# Patient Record
Sex: Female | Born: 1990 | Hispanic: No | Marital: Single
Health system: Southern US, Community
[De-identification: ages and names within clinical notes are randomized; demographics above are authoritative.]

## PROBLEM LIST (undated history)

## (undated) DIAGNOSIS — T4145XA Adverse effect of unspecified anesthetic, initial encounter: Secondary | ICD-10-CM

## (undated) DIAGNOSIS — Z789 Other specified health status: Secondary | ICD-10-CM

## (undated) DIAGNOSIS — T8859XA Other complications of anesthesia, initial encounter: Secondary | ICD-10-CM

## (undated) HISTORY — PX: CHOLECYSTECTOMY: SHX55

## (undated) HISTORY — PX: UNILATERAL SALPINGECTOMY: SHX6160

## (undated) HISTORY — PX: DILATION AND CURETTAGE OF UTERUS: SHX78

---
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
## 2012-02-26 ENCOUNTER — Emergency Department: Payer: Self-pay | Admitting: Emergency Medicine

## 2012-02-27 LAB — LIPASE, BLOOD: Lipase: 101 U/L (ref 73–393)

## 2012-02-27 LAB — CBC
HGB: 11.9 g/dL — ABNORMAL LOW (ref 12.0–16.0)
MCH: 29.8 pg (ref 26.0–34.0)
MCV: 88 fL (ref 80–100)
Platelet: 240 10*3/uL (ref 150–440)
RBC: 4 10*6/uL (ref 3.80–5.20)
RDW: 13.1 % (ref 11.5–14.5)
WBC: 9.7 10*3/uL (ref 3.6–11.0)

## 2012-02-27 LAB — URINALYSIS, COMPLETE
Blood: NEGATIVE
Glucose,UR: NEGATIVE mg/dL (ref 0–75)
Nitrite: NEGATIVE
Ph: 5 (ref 4.5–8.0)
Protein: 30
RBC,UR: 2 /HPF (ref 0–5)
Squamous Epithelial: 10
WBC UR: 19 /HPF (ref 0–5)

## 2012-02-27 LAB — COMPREHENSIVE METABOLIC PANEL
Albumin: 3.9 g/dL (ref 3.8–5.6)
BUN: 11 mg/dL (ref 7–18)
Bilirubin,Total: 0.5 mg/dL (ref 0.2–1.0)
EGFR (African American): >60
EGFR (Non-African Amer.): >60
Osmolality: 281 (ref 275–301)
SGPT (ALT): 26 U/L
Total Protein: 7.6 g/dL (ref 6.4–8.6)

## 2012-03-01 ENCOUNTER — Ambulatory Visit: Payer: Self-pay | Admitting: Surgery

## 2012-03-02 LAB — PATHOLOGY REPORT

## 2013-06-02 ENCOUNTER — Ambulatory Visit: Payer: Self-pay | Admitting: Family Medicine

## 2013-06-02 LAB — URINALYSIS, COMPLETE
Glucose,UR: NEGATIVE mg/dL (ref 0–75)
Protein: 30

## 2014-07-31 ENCOUNTER — Ambulatory Visit: Payer: Self-pay

## 2014-07-31 LAB — PREGNANCY, URINE: PREGNANCY TEST, URINE: NEGATIVE m[IU]/mL

## 2015-03-22 NOTE — H&P (Signed)
PATIENT NAME:  Tammy CoppSCUAL, CYNTHIA MR#:  [id number] DATE OF BIRTH:  13-Mar-1991  DATE OF ADMISSION:  03/01/2012  CHIEF COMPLAINT: Right upper quadrant pain.   HISTORY OF PRESENT ILLNESS: This is a patient with recurrent episodes of right upper quadrant pain associated with fatty food intolerance, mostly happens after meal in the evening. This has been going on for several weeks but she was in the Emergency Room three days ago with nausea, vomiting, and pain. Her pain is much improved at this point and she has had no further nausea or vomiting but is here to have elective laparoscopic cholecystectomy for control of her symptoms. She denies jaundice or acholic stools. No fevers or chills and no hematuria or melena.   PAST MEDICAL HISTORY: None.   PAST SURGICAL HISTORY: None.   ALLERGIES: None.   MEDICATIONS: None.   FAMILY HISTORY: Noncontributory.   SOCIAL HISTORY: She works as a Forensic psychologistproduction line worker. Does not smoke or drink.   REVIEW OF SYSTEMS: 10 system review has been performed and negative and is documented in the office chart.   PHYSICAL EXAMINATION:   GENERAL: Healthy Hispanic female patient. She appears in no acute distress.   VITAL SIGNS: Stable. She is afebrile.   HEENT: No scleral icterus.    NECK: No palpable neck nodes.   CHEST: Clear to auscultation.   CARDIAC: Regular rate and rhythm.   ABDOMEN: Soft, nontender. There is a supraumbilical piercing scar that is well healed she is no longer using. No Murphy's sign, etc.   EXTREMITIES: Without edema.   NEUROLOGIC: Grossly intact.   INTEGUMENTARY: No jaundice.   LABORATORY, DIAGNOSTIC, AND RADIOLOGICAL DATA: Laboratory values show no sign of choledocholithiasis. Hemoglobin and hematocrit is normal. White blood cell count is normal. Electrolytes are normal.   An ultrasound shows stones, slightly thickened gallbladder wall.   ASSESSMENT AND PLAN: This is a patient with symptomatic cholelithiasis. I have recommended  laparoscopic cholecystectomy. She wishes to have this done as soon as possible to avoid having this happen again and we have tried to schedule this for 03/01/2012. The rationale has been discussed. The options of observation and been reviewed, the risks of bleeding, infection, recurrence of symptoms, failure to resolve her symptoms, open procedure, bile duct damage, bile duct leak, retained common bile duct stone or bowel injury, any of which could require further surgery and/or ERCP, stent, and papillotomy have all been reviewed with her. She understood and agreed to proceed.    ____________________________ Adah Salvageichard E. Excell Seltzerooper, MD rec:drc D: 02/29/2012 14:12:18 ET T: 02/29/2012 14:24:15 ET JOB#: [id number]  cc: Adah Salvageichard E. Excell Seltzerooper, MD, <Dictator> Lattie HawICHARD E Rydge Texidor MD ELECTRONICALLY SIGNED 02/29/2012 17:26

## 2015-12-17 ENCOUNTER — Encounter: Payer: Self-pay | Admitting: *Deleted

## 2015-12-17 ENCOUNTER — Ambulatory Visit
Admission: EM | Admit: 2015-12-17 | Discharge: 2015-12-17 | Disposition: A | Discharge status: Home / Self Care | Payer: Managed Care, Other (non HMO) | Attending: Family Medicine | Admitting: Family Medicine

## 2015-12-17 DIAGNOSIS — J042 Acute laryngotracheitis: Secondary | ICD-10-CM

## 2015-12-17 DIAGNOSIS — J019 Acute sinusitis, unspecified: Secondary | ICD-10-CM | POA: Diagnosis not present

## 2015-12-17 LAB — RAPID STREP SCREEN (MED CTR MEBANE ONLY): STREPTOCOCCUS, GROUP A SCREEN (DIRECT): NEGATIVE

## 2015-12-17 MED ORDER — BENZONATATE 200 MG PO CAPS: 200.0000 mg | ORAL_CAPSULE | Freq: Three times a day (TID) | ORAL | Status: DC | PRN

## 2015-12-17 MED ORDER — AMOXICILLIN-POT CLAVULANATE 875-125 MG PO TABS: 1.0000 | ORAL_TABLET | Freq: Two times a day (BID) | ORAL | Status: DC

## 2015-12-17 MED ORDER — FEXOFENADINE-PSEUDOEPHED ER 180-240 MG PO TB24: 1.0000 | ORAL_TABLET | Freq: Every day | ORAL | Status: DC

## 2015-12-17 NOTE — ED Provider Notes (Signed)
CSN: [id number]     Arrival date & time 12/17/15  1325 History   First MD Initiated Contact with Patient 12/17/15 1404     Nurses notes were reviewed. Chief Complaint  Patient presents with  . Otalgia    bilateral  . Sore Throat    Patient states she's been sick now since January 9. States is progressively getting worse she doesn't her voice completely very hoarse. The last days Since worse sore and she's lost her voice as well. She also complains of left ear pain as well. Patient does not smoke and does not. Other than surgery cholecystectomy no other problems     (Consider location/radiation/quality/duration/timing/severity/associated sxs/prior Treatment) Patient is a 25 y.o. female presenting with ear pain and pharyngitis. The history is provided by the patient. No language interpreter was used.  Otalgia Location:  Left Quality:  Pressure Severity:  Moderate Onset quality:  Sudden Progression:  Worsening Chronicity:  New Context: not direct blow   Relieved by:  OTC medications Ineffective treatments:  OTC medications Associated symptoms: cough, rhinorrhea and sore throat   Associated symptoms: no abdominal pain and no fever   Risk factors: no chronic ear infection   Sore Throat Pertinent negatives include no abdominal pain.    History reviewed. No pertinent past medical history. Past Surgical History  Procedure Laterality Date  . Cholecystectomy     History reviewed. No pertinent family history. Social History  Substance Use Topics  . Smoking status: Never Smoker   . Smokeless tobacco: Never Used  . Alcohol Use: Yes   OB History    No data available     Review of Systems  Constitutional: Negative for fever.  HENT: Positive for ear pain, rhinorrhea and sore throat.   Respiratory: Positive for cough.   Gastrointestinal: Negative for abdominal pain.    Allergies  Review of patient's allergies indicates no known allergies.  Home Medications   Prior to  Admission medications   Medication Sig Start Date End Date Taking? Authorizing Provider  amoxicillin-clavulanate (AUGMENTIN) 875-125 MG tablet Take 1 tablet by mouth 2 (two) times daily. 12/17/15   Hassan Rowan, MD  benzonatate (TESSALON) 200 MG capsule Take 1 capsule (200 mg total) by mouth 3 (three) times daily as needed for cough. 12/17/15   Hassan Rowan, MD  fexofenadine-pseudoephedrine (ALLEGRA-D ALLERGY & CONGESTION) 180-240 MG 24 hr tablet Take 1 tablet by mouth daily. 12/17/15   Hassan Rowan, MD   Meds Ordered and Administered this Visit  Medications - No data to display  BP 116/52 mmHg  Pulse 76  Temp(Src) 98 F (36.7 C) (Oral)  Resp 20  Ht  (1.575 m)  Wt 152 lb (68.947 kg)  BMI 27.79 kg/m2  SpO2 98%  LMP 11/30/2014 No data found.   Physical Exam  Constitutional: She is oriented to person, place, and time. She appears well-developed and well-nourished.  HENT:  Head: Normocephalic and atraumatic.  Right Ear: Hearing and external ear normal. Tympanic membrane is bulging.  Left Ear: Hearing and external ear normal. Tympanic membrane is bulging.  Nose: Mucosal edema and rhinorrhea present.  Mouth/Throat: Uvula is midline. Posterior oropharyngeal erythema present.  Left also is much larger than the right erythema of the oral mucosa and neck supple patient markedly hoarse as well  Eyes: Conjunctivae are normal. Pupils are equal, round, and reactive to light.  Neck: Normal range of motion. Neck supple. No thyromegaly present.  Neurological: She is alert and oriented to person, place, and time.  Skin: Skin is warm and dry. No erythema.  Psychiatric: She has a normal mood and affect.  Vitals reviewed.   ED Course  Procedures (including critical care time)  Labs Review Labs Reviewed  RAPID STREP SCREEN (NOT AT [facility])  CULTURE, GROUP A STREP [facility])    Imaging Review No results found.   Visual Acuity Review  Right Eye Distance:   Left Eye Distance:   Bilateral  Distance:    Right Eye Near:   Left Eye Near:    Bilateral Near:         MDM   1. Acute laryngitis and tracheitis   2. Acute sinusitis, recurrence not specified, unspecified location    Place Augmentin 875 Allegra-D and Tessalon Perles. Note for today and tomorrow return follow-up when necessary.    Hassan Rowan, MD 12/17/15 1444

## 2015-12-17 NOTE — Discharge Instructions (Signed)
Laryngitis Laryngitis is swelling (inflammation) of your vocal cords. This causes hoarseness, coughing, loss of voice, sore throat, or a dry throat. When your vocal cords are inflamed, your voice sounds different. Laryngitis can be temporary (acute) or long-term (chronic). Most cases of acute laryngitis improve with time. Chronic laryngitis is laryngitis that lasts for more than three weeks. HOME CARE  Drink enough fluid to keep your pee (urine) clear or pale yellow.  Breathe in moist air. Use a humidifier if you live in a dry climate.  Take medicines only as told by your doctor.  Do not smoke cigarettes or electronic cigarettes. If you need help quitting, ask your doctor.  Talk as little as possible. Also avoid whispering, which can cause vocal strain.  Write instead of talking. Do this until your voice is back to normal. GET HELP IF:  You have a fever.  Your pain is worse.  You have trouble swallowing. GET HELP RIGHT AWAY IF:  You cough up blood.  You have trouble breathing.   This information is not intended to replace advice given to you by your health care provider. Make sure you discuss any questions you have with your health care provider.   Document Released: 11/03/2011 Document Revised: 12/05/2014 Document Reviewed: 04/29/2014 Elsevier Interactive Patient Education 2016 [employer].  Sinusitis, Adult Sinusitis is redness, soreness, and puffiness (inflammation) of the air pockets in the bones of your face (sinuses). The redness, soreness, and puffiness can cause air and mucus to get trapped in your sinuses. This can allow germs to grow and cause an infection.  HOME CARE   Drink enough fluids to keep your pee (urine) clear or pale yellow.  Use a humidifier in your home.  Run a hot shower to create steam in the bathroom. Sit in the bathroom with the door closed. Breathe in the steam 3-4 times a day.  Put a warm, moist washcloth on your face 3-4 times a day, or as  told by your doctor.  Use salt water sprays (saline sprays) to wet the thick fluid in your nose. This can help the sinuses drain.  Only take medicine as told by your doctor. GET HELP RIGHT AWAY IF:   Your pain gets worse.  You have very bad headaches.  You are sick to your stomach (nauseous).  You throw up (vomit).  You are very sleepy (drowsy) all the time.  Your face is puffy (swollen).  Your vision changes.  You have a stiff neck.  You have trouble breathing. MAKE SURE YOU:   Understand these instructions.  Will watch your condition.  Will get help right away if you are not doing well or get worse.   This information is not intended to replace advice given to you by your health care provider. Make sure you discuss any questions you have with your health care provider.   Document Released: 05/02/2008 Document Revised: 12/05/2014 Document Reviewed: 06/19/2012 Elsevier Interactive Patient Education 2016 [employer] Inc[employer]  Upper Respiratory Infection, Adult Most upper respiratory infections (URIs) are caused by a virus. A URI affects the nose, throat, and upper air passages. The most common type of URI is often called "the common cold." HOME CARE   Take medicines only as told by your doctor.  Gargle warm saltwater or take cough drops to comfort your throat as told by your doctor.  Use a warm mist humidifier or inhale steam from a shower to increase air moisture. This may make it easier to breathe.  Drink  enough fluid to keep your pee (urine) clear or pale yellow.  Eat soups and other clear broths.  Have a healthy diet.  Rest as needed.  Go back to work when your fever is gone or your doctor says it is okay.  You may need to stay home longer to avoid giving your URI to others.  You can also wear a face mask and wash your hands often to prevent spread of the virus.  Use your inhaler more if you have asthma.  Do not use any tobacco products, including  cigarettes, chewing tobacco, or electronic cigarettes. If you need help quitting, ask your doctor. GET HELP IF:  You are getting worse, not better.  Your symptoms are not helped by medicine.  You have chills.  You are getting more short of breath.  You have brown or red mucus.  You have yellow or brown discharge from your nose.  You have pain in your face, especially when you bend forward.  You have a fever.  You have puffy (swollen) neck glands.  You have pain while swallowing.  You have white areas in the back of your throat. GET HELP RIGHT AWAY IF:   You have very bad or constant:  Headache.  Ear pain.  Pain in your forehead, behind your eyes, and over your cheekbones (sinus pain).  Chest pain.  You have long-lasting (chronic) lung disease and any of the following:  Wheezing.  Long-lasting cough.  Coughing up blood.  A change in your usual mucus.  You have a stiff neck.  You have changes in your:  Vision.  Hearing.  Thinking.  Mood. MAKE SURE YOU:   Understand these instructions.  Will watch your condition.  Will get help right away if you are not doing well or get worse.   This information is not intended to replace advice given to you by your health care provider. Make sure you discuss any questions you have with your health care provider.   Document Released: 05/02/2008 Document Revised: 03/31/2015 Document Reviewed: 02/19/2014 Elsevier Interactive Patient Education [employer]

## 2015-12-17 NOTE — ED Notes (Signed)
Patient started having the symptom of sore throat on 12/09/15. Ear pain started 2 days ago. Patient does not have a history of ear pain or strep throat.

## 2015-12-19 LAB — CULTURE, GROUP A STREP (THRC)

## 2016-11-17 ENCOUNTER — Ambulatory Visit
Admission: EM | Admit: 2016-11-17 | Discharge: 2016-11-17 | Disposition: A | Discharge status: Home / Self Care | Payer: Managed Care, Other (non HMO) | Attending: Internal Medicine | Admitting: Internal Medicine

## 2016-11-17 DIAGNOSIS — Z3201 Encounter for pregnancy test, result positive: Secondary | ICD-10-CM

## 2016-11-17 DIAGNOSIS — O039 Complete or unspecified spontaneous abortion without complication: Principal | ICD-10-CM

## 2016-11-17 LAB — URINALYSIS, COMPLETE (UACMP) WITH MICROSCOPIC
Bilirubin Urine: NEGATIVE
Glucose, UA: NEGATIVE mg/dL
Ketones, ur: NEGATIVE mg/dL
Leukocytes, UA: NEGATIVE
Nitrite: POSITIVE — AB
Protein, ur: NEGATIVE mg/dL
Specific Gravity, Urine: 1.02 (ref 1.005–1.030)
pH: 7.5 (ref 5.0–8.0)

## 2016-11-17 LAB — PREGNANCY, URINE: Preg Test, Ur: POSITIVE — AB

## 2016-11-17 NOTE — ED Triage Notes (Addendum)
Pt c/o having a menstrual cycle for the last 12 days, She says typically they only last 3 days. She denies weakness or fatigue. She thought she could be pregnant, however didn't take a pregnancy test and then her cycle started. Bleeding is sometimes bright red and then dark .She does have some cramping.

## 2016-11-17 NOTE — ED Provider Notes (Signed)
[facility]    CSN: [id number] Arrival date & time: 11/17/16  1529     History   Chief Complaint Chief Complaint  Patient presents with  . Vaginal Bleeding    HPI Tammy Delacruz is a 25 y.o. female.   C/o vaginal bleeding x2 weeks.  Pt missed her expected period this month.  Started bleeding 1 week later.  The bleeding has been intermittently spotty to heavy.  Currently light.  Normal periods the prior two months.  She is sexually active.      No past medical history on file.  There are no active problems to display for this patient.   Past Surgical History:  Procedure Laterality Date  . CHOLECYSTECTOMY      OB History    No data available       Home Medications    Prior to Admission medications   Not on File    Family History No family history on file.  Social History Social History  Substance Use Topics  . Smoking status: Never Smoker  . Smokeless tobacco: Never Used  . Alcohol use Yes     Allergies   Patient has no known allergies.   Review of Systems Review of Systems  Constitutional: Negative for chills and fever.  HENT: Negative for sore throat and tinnitus.   Eyes: Negative for redness.  Respiratory: Negative for cough and shortness of breath.   Cardiovascular: Negative for chest pain and palpitations.  Gastrointestinal: Negative for abdominal pain (cramping), diarrhea, nausea and vomiting.  Genitourinary: Positive for vaginal bleeding. Negative for dysuria, frequency, urgency and vaginal discharge.  Musculoskeletal: Negative for myalgias.  Skin: Negative for rash.       No lesions  Neurological: Negative for weakness.  Hematological: Does not bruise/bleed easily.  Psychiatric/Behavioral: Negative for suicidal ideas.     Physical Exam Triage Vital Signs ED Triage Vitals  Enc Vitals Group     BP 11/17/16 1607 113/62     Pulse Rate 11/17/16 1607 65     Resp 11/17/16 1607 18     Temp 11/17/16 1607 98.5  F (36.9 C)     Temp Source 11/17/16 1607 Oral     SpO2 11/17/16 1607 100 %     Weight 11/17/16 1607 165 lb (74.8 kg)     Height 11/17/16 1607 5\' 1"  (1.549 m)     Head Circumference --      Peak Flow --      Pain Score 11/17/16 1609 0     Pain Loc --      Pain Edu? --      Excl. in GC? --    No data found.   Updated Vital Signs BP 113/62 (BP Location: Left Arm)   Pulse 65   Temp 98.5 F (36.9 C) (Oral)   Resp 18   Ht 5\' 1"  (1.549 m)   Wt 165 lb (74.8 kg)   LMP 11/17/2016   SpO2 100%   BMI 31.18 kg/m   Visual Acuity Right Eye Distance:   Left Eye Distance:   Bilateral Distance:    Right Eye Near:   Left Eye Near:    Bilateral Near:     Physical Exam  Constitutional: She is oriented to person, place, and time. She appears well-developed and well-nourished. No distress.  HENT:  Head: Normocephalic and atraumatic.  Mouth/Throat: Oropharynx is clear and moist.  Eyes: Conjunctivae and EOM are normal. Pupils are equal, round, and reactive to light.  No scleral icterus.  Neck: Normal range of motion. Neck supple. No JVD present. No tracheal deviation present. No thyromegaly present.  Cardiovascular: Normal rate, regular rhythm and normal heart sounds.  Exam reveals no gallop and no friction rub.   No murmur heard. Pulmonary/Chest: Effort normal and breath sounds normal.  Abdominal: Soft. Bowel sounds are normal. She exhibits no distension. There is no tenderness.  Genitourinary:  Genitourinary Comments: Deferred  Musculoskeletal: Normal range of motion. She exhibits no edema.  Lymphadenopathy:    She has no cervical adenopathy.  Neurological: She is alert and oriented to person, place, and time. No cranial nerve deficit.  Skin: Skin is warm and dry.  Psychiatric: She has a normal mood and affect. Her behavior is normal. Judgment and thought content normal.  Nursing note and vitals reviewed.    UC Treatments / Results  Labs (all labs ordered are listed, but only  abnormal results are displayed) Labs Reviewed  PREGNANCY, URINE - Abnormal; Notable for the following:       Result Value   Preg Test, Ur POSITIVE (*)    All other components within normal limits  URINALYSIS, COMPLETE (UACMP) WITH MICROSCOPIC - Abnormal; Notable for the following:    APPearance CLOUDY (*)    Hgb urine dipstick LARGE (*)    Nitrite POSITIVE (*)    Squamous Epithelial / LPF 6-30 (*)    Bacteria, UA MANY (*)    All other components within normal limits    EKG  EKG Interpretation None       Radiology No results found.  Procedures Procedures (including critical care time)  Medications Ordered in UC Medications - No data to display   Initial Impression / Assessment and Plan / UC Course  I have reviewed the triage vital signs and the nursing notes.  Pertinent labs & imaging results that were available during my care of the patient were reviewed by me and considered in my medical decision making (see chart for details).  Clinical Course     Positive urine pregnancy in setting of prolonged vaginal bleeding.  Likely spontaneous abortion, however I emphasized to the patient the possibility of spotting/passing clots even when there is a viable pregnancy (given it is difficult to quantify bleeding).  Thus, she must follow up with obstetrics ASAP.    Final Clinical Impressions(s) / UC Diagnoses   Final diagnoses:  Abortion, spontaneous    New Prescriptions New Prescriptions   No medications on file     Arnaldo NatalMichael S Cordelle Dahmen, MD 11/17/16 1728

## 2016-11-22 ENCOUNTER — Encounter: Payer: Self-pay | Admitting: Emergency Medicine

## 2016-11-22 ENCOUNTER — Emergency Department
Admission: EM | Admit: 2016-11-22 | Discharge: 2016-11-22 | Disposition: A | Discharge status: Home / Self Care | Payer: Managed Care, Other (non HMO) | Attending: Emergency Medicine | Admitting: Emergency Medicine

## 2016-11-22 ENCOUNTER — Emergency Department: Payer: Managed Care, Other (non HMO)

## 2016-11-22 DIAGNOSIS — Z3A01 Less than 8 weeks gestation of pregnancy: Secondary | ICD-10-CM

## 2016-11-22 DIAGNOSIS — O200 Threatened abortion: Principal | ICD-10-CM

## 2016-11-22 DIAGNOSIS — O209 Hemorrhage in early pregnancy, unspecified: Secondary | ICD-10-CM | POA: Diagnosis present

## 2016-11-22 LAB — COMPREHENSIVE METABOLIC PANEL
ALBUMIN: 4.2 g/dL (ref 3.5–5.0)
ALK PHOS: 61 U/L (ref 38–126)
ALT: 19 U/L (ref 14–54)
ANION GAP: 8 (ref 5–15)
AST: 23 U/L (ref 15–41)
BUN: 14 mg/dL (ref 6–20)
CALCIUM: 9.3 mg/dL (ref 8.9–10.3)
CO2: 22 mmol/L (ref 22–32)
Chloride: 107 mmol/L (ref 101–111)
Creatinine, Ser: 0.72 mg/dL (ref 0.44–1.00)
GFR calc non Af Amer: >60 mL/min (ref >60)
GLUCOSE: 111 mg/dL — AB (ref 65–99)
POTASSIUM: 3.6 mmol/L (ref 3.5–5.1)
SODIUM: 137 mmol/L (ref 135–145)
Total Bilirubin: 0.4 mg/dL (ref 0.3–1.2)
Total Protein: 7.9 g/dL (ref 6.5–8.1)

## 2016-11-22 LAB — ABO/RH: ABO/RH(D): O POS

## 2016-11-22 LAB — CBC WITH DIFFERENTIAL/PLATELET
BASOS PCT: 0 %
Basophils Absolute: 0 10*3/uL (ref 0–0.1)
EOS ABS: 0.5 10*3/uL (ref 0–0.7)
Eosinophils Relative: 5 %
HCT: 37.5 % (ref 35.0–47.0)
HEMOGLOBIN: 13.2 g/dL (ref 12.0–16.0)
LYMPHS ABS: 3.6 10*3/uL (ref 1.0–3.6)
Lymphocytes Relative: 35 %
MCH: 31 pg (ref 26.0–34.0)
MCHC: 35.1 g/dL (ref 32.0–36.0)
MCV: 88.4 fL (ref 80.0–100.0)
MONO ABS: 0.7 10*3/uL (ref 0.2–0.9)
MONOS PCT: 7 %
NEUTROS PCT: 53 %
Neutro Abs: 5.6 10*3/uL (ref 1.4–6.5)
PLATELETS: 276 10*3/uL (ref 150–440)
RBC: 4.24 MIL/uL (ref 3.80–5.20)
RDW: 13.1 % (ref 11.5–14.5)
WBC: 10.5 10*3/uL (ref 3.6–11.0)

## 2016-11-22 LAB — HCG, QUANTITATIVE, PREGNANCY: hCG, Beta Chain, Quant, S: 58 m[IU]/mL — ABNORMAL HIGH (ref <5)

## 2016-11-22 NOTE — ED Triage Notes (Signed)
Patient presents to the ED with vaginal bleeding x 17 days and positive pregnancy test at Urgent Care on Thursday.  [facility] because she would need an US or blood work to confirm her positive pregnancy test.  Patient reports needing to change her pad approx. 3 x day.  Patient denies pain, no obvious distress at this time.

## 2016-11-22 NOTE — ED Notes (Signed)
Pt alert and oriented X4, active, cooperative, pt in NAD. RR even and unlabored, color WNL.    

## 2016-11-22 NOTE — ED Provider Notes (Signed)
[facility] Emergency Department Provider Note        Time seen: ----------------------------------------- 4:36 PM on 11/22/2016 -----------------------------------------    I have reviewed the triage vital signs and the nursing notes.   HISTORY  Chief Complaint Vaginal Bleeding    HPI Tammy Delacruz is a 25 y.o. female who presents to the ER for vaginal bleeding for the last 17 days. She reportedly has a positive pregnancy test as an outpatient and she was seen in urgent care on Thursday and was told she might be having a miscarriage but they did not have the equipment to do an ultrasound. She reports nearly need to change her pad approximately 3 times a day. She denies any significant pain at this time, denies any other complaints.   History reviewed. No pertinent past medical history.  There are no active problems to display for this patient.   Past Surgical History:  Procedure Laterality Date  . CHOLECYSTECTOMY      Allergies Patient has no known allergies.  Social History Social History  Substance Use Topics  . Smoking status: Never Smoker  . Smokeless tobacco: Never Used  . Alcohol use Yes    Review of Systems Constitutional: Negative for fever. Cardiovascular: Negative for chest pain. Respiratory: Negative for shortness of breath. Gastrointestinal: Negative for abdominal pain, vomiting and diarrhea. Genitourinary: Negative for dysuria.Positive for vaginal bleeding Musculoskeletal: Negative for back pain. Skin: Negative for rash. Neurological: Negative for headaches, focal weakness or numbness.  10-point ROS otherwise negative.  ____________________________________________   PHYSICAL EXAM:  VITAL SIGNS: ED Triage Vitals  Enc Vitals Group     BP 11/22/16 1602 (!) 142/71     Pulse Rate 11/22/16 1602 78     Resp 11/22/16 1602 18     Temp 11/22/16 1602 97.7 F (36.5 C)     Temp Source 11/22/16 1602 Oral   SpO2 11/22/16 1602 98 %     Weight 11/22/16 1602 165 lb (74.8 kg)     Height 11/22/16 1602 5\' 2"  (1.575 m)     Head Circumference --      Peak Flow --      Pain Score 11/22/16 1633 0     Pain Loc --      Pain Edu? --      Excl. in GC? --     Constitutional: Alert and oriented. Well appearing and in no distress. Eyes: Conjunctivae are normal. PERRL. Normal extraocular movements. ENT   Head: Normocephalic and atraumatic.   Nose: No congestion/rhinnorhea.   Mouth/Throat: Mucous membranes are moist.   Neck: No stridor. Cardiovascular: Normal rate, regular rhythm. No murmurs, rubs, or gallops. Respiratory: Normal respiratory effort without tachypnea nor retractions. Breath sounds are clear and equal bilaterally. No wheezes/rales/rhonchi. Gastrointestinal: Soft and nontender. Normal bowel sounds Musculoskeletal: Nontender with normal range of motion in all extremities. No lower extremity tenderness nor edema. Neurologic:  Normal speech and language. No gross focal neurologic deficits are appreciated.  Skin:  Skin is warm, dry and intact. No rash noted. Psychiatric: Mood and affect are normal. Speech and behavior are normal.  ____________________________________________  ED COURSE:  Pertinent labs & imaging results that were available during my care of the patient were reviewed by me and considered in my medical decision making (see chart for details). Clinical Course   Patient is in no distress, we will assess with labs and ultrasound.  Procedures ____________________________________________   LABS (pertinent positives/negatives)  Labs Reviewed  HCG, QUANTITATIVE, PREGNANCY -  Abnormal; Notable for the following:       Result Value   hCG, Beta Chain, Quant, S 58 (*)    All other components within normal limits  COMPREHENSIVE METABOLIC PANEL - Abnormal; Notable for the following:    Glucose, Bld 111 (*)    All other components within normal limits  CBC WITH  DIFFERENTIAL/PLATELET  ABO/RH    RADIOLOGY  Pregnancy ultrasound IMPRESSION: 1. No visible gestational sac or adnexal mass. No free pelvic fluid. Differential diagnostic considerations include very early pregnancy or prior spontaneous abortion. Correlate with quantitative beta HCG trend and follow up. ____________________________________________  FINAL ASSESSMENT AND PLAN  Threatened miscarriage  Plan: Patient with labs and imaging as dictated above. Patient with an hCG level of 58, likely miscarriage. I will advise follow-up in 2 days for repeat hCG testing. This is likely spontaneous miscarriage.   Tammy FilbertWilliams, Tammy Padget E, MD   Note: This dictation was prepared with Dragon dictation. Any transcriptional errors that result from this process are unintentional    Tammy FilbertJonathan Delacruz Heidee Audi, MD 11/22/16 [phone]

## 2016-11-22 NOTE — ED Notes (Signed)
Pt vaginal bleeding X 17 days. Seen at urgent care last week and told she was having possible miscarriage. Pt unaware if she was pregnant. Pt alert and oriented X4, active, cooperative, pt in NAD. RR even and unlabored, color WNL.

## 2016-11-22 NOTE — ED Notes (Signed)
ED Provider at bedside. 

## 2016-11-25 ENCOUNTER — Encounter: Payer: Self-pay | Admitting: Emergency Medicine

## 2016-11-25 ENCOUNTER — Emergency Department
Admission: EM | Admit: 2016-11-25 | Discharge: 2016-11-25 | Disposition: A | Discharge status: Home / Self Care | Payer: Managed Care, Other (non HMO) | Attending: Emergency Medicine | Admitting: Emergency Medicine

## 2016-11-25 DIAGNOSIS — O200 Threatened abortion: Principal | ICD-10-CM

## 2016-11-25 DIAGNOSIS — Z3A00 Weeks of gestation of pregnancy not specified: Secondary | ICD-10-CM

## 2016-11-25 DIAGNOSIS — O209 Hemorrhage in early pregnancy, unspecified: Secondary | ICD-10-CM | POA: Diagnosis present

## 2016-11-25 LAB — HCG, QUANTITATIVE, PREGNANCY: HCG, BETA CHAIN, QUANT, S: 56 m[IU]/mL — AB (ref <5)

## 2016-11-25 NOTE — ED Provider Notes (Signed)
[facility] Emergency Department Provider Note  ____________________________________________  Time seen: Approximately 5:32 PM  I have reviewed the triage vital signs and the nursing notes.   HISTORY  Chief Complaint Labs Only   HPI Tammy Delacruz is a 25 y.o. female presents to the emergency department for repeat serum hCG level. He was evaluated here 3 days ago for vaginal bleeding. She denies abdominal cramping or pain today. She states the bleeding has been a little heavier today, but manageable. She does not have an appointment scheduled with gynecology.  History reviewed. No pertinent past medical history.  There are no active problems to display for this patient.   Past Surgical History:  Procedure Laterality Date  . CHOLECYSTECTOMY      Prior to Admission medications   Not on File    Allergies Patient has no known allergies.  No family history on file.  Social History Social History  Substance Use Topics  . Smoking status: Never Smoker  . Smokeless tobacco: Never Used  . Alcohol use Yes    Review of Systems Constitutional: Negative for fever. Gastrointestinal: No abdominal pain.  No nausea, no vomiting.  No diarrhea.  Musculoskeletal: Negative for generalized body aches. Skin: Negative for rash/lesion/wound. Neurological: Negative for headaches, focal weakness or numbness.  ____________________________________________   PHYSICAL EXAM:  VITAL SIGNS:114/66; heart rate 70, respiratory rate 20, SPO2 99%, temperature 98.6. ED Triage Vitals [11/25/16 1723]  Enc Vitals Group     BP      Pulse      Resp      Temp      Temp src      SpO2      Weight      Height      Head Circumference      Peak Flow      Pain Score 0     Pain Loc      Pain Edu?      Excl. in GC?     Constitutional: Alert and oriented. Well appearing and in no acute distress. Eyes: Conjunctivae are normal. PERRL. EOMI. Head:  Atraumatic. Nose: No congestion/rhinnorhea. Mouth/Throat: Mucous membranes are moist. Neck: No stridor.  Cardiovascular: Normal rate, regular rhythm. Good peripheral circulation. Respiratory: Normal respiratory effort. Musculoskeletal: Full ROM throughout.  Neurologic:  Normal speech and language. No gross focal neurologic deficits are appreciated. Speech is normal. No gait instability. Skin:  Skin is warm, dry and intact. No rash noted. Psychiatric: Mood and affect are normal. Speech and behavior are normal.  ____________________________________________   LABS (all labs ordered are listed, but only abnormal results are displayed)  Labs Reviewed  HCG, QUANTITATIVE, PREGNANCY - Abnormal; Notable for the following:       Result Value   hCG, Beta Chain, Quant, S 56 (*)    All other components within normal limits   ____________________________________________  EKG  Not indicated ____________________________________________  RADIOLOGY  Not indicated today ____________________________________________   PROCEDURES  None ____________________________________________   INITIAL IMPRESSION / ASSESSMENT AND PLAN / ED COURSE  Clinical Course     ___________________________________________   FINAL CLINICAL IMPRESSION(S) / ED DIAGNOSES  25 year old female presenting to the emergency department for a repeat beta hCG. She was here on Tuesday for pelvic pain and vaginal bleeding. Her beta hCG during that visit was 58. Today, it is 6056. She has had no increase in pain or bleeding. She states that she saturates approximately 3 pads per day. She was instructed to follow-up  with gynecology and she is to call on Tuesday for an appointment. She was advised to return to the emergency department if she is saturating 1 pad or more per hour or if she has an increase in abdominal pain and cramping. She was given pelvic rest instructions as well.  Final diagnoses:  Threatened miscarriage        Chinita PesterCari B Trevyon Swor, FNP 11/25/16 2212    Minna AntisKevin Paduchowski, MD 11/25/16 2227

## 2016-11-25 NOTE — Discharge Instructions (Signed)
Return to the ER if you are having to change a saturated pad every hour or for other symptoms of concern.  Follow up with gynecology as soon as possible--call Tuesday morning for an appointment. Do not use tampons. No sexual intercourse until cleared by gynecology. Do not use douche or insert anything into the vagina.

## 2016-11-25 NOTE — ED Triage Notes (Signed)
Reports miscarriage 2 days ago, told to return for repeat beta

## 2016-12-01 ENCOUNTER — Ambulatory Visit (INDEPENDENT_AMBULATORY_CARE_PROVIDER_SITE_OTHER): Payer: Commercial Managed Care - PPO | Admitting: Obstetrics and Gynecology

## 2016-12-01 ENCOUNTER — Encounter: Payer: Self-pay | Admitting: Obstetrics and Gynecology

## 2016-12-01 VITALS — BP 100/63 | HR 76 | Ht 62.0 in | Wt 155.2 lb

## 2016-12-01 DIAGNOSIS — O200 Threatened abortion: Principal | ICD-10-CM

## 2016-12-01 HISTORY — DX: Threatened abortion: O20.0

## 2016-12-01 NOTE — Progress Notes (Signed)
GYN ENCOUNTER NOTE  Subjective:       Tammy Delacruz is a 26 y.o. [id number] female is here for gynecologic evaluation of the following issues:  1.Threatened abortion  Patient has been experiencing daily bleeding since early December without passage of tissue or onset of pelvic pain. Emergency room evaluation is notable for no intrauterine pregnancy and Abnormal rise of quantitative hCG titers. Patient is in a monogamous relationship over the last 6 months; contraception none.  O+ blood type Quantitative hCG 11/22/2016-56 Quantitative hCG 11/25/2016-58 No abdominal pelvic pain Light vaginal bleeding continues No history of tissue passage per vagina No ectopic risk factors (STD history negative; endometriosis history negative; surgical history negative)   Gynecologic History Patient's last menstrual period was 10/01/2016 (approximate). Contraception: none Last Pap: Never Last mammogram: N/A  Obstetric History OB History  Gravida Para Term Preterm AB Living  1       1    SAB TAB Ectopic Multiple Live Births  1            # Outcome Date GA Lbr Len/2nd Weight Sex Delivery Anes PTL Lv  1 SAB 2017              No past medical history on file.  Past Surgical History:  Procedure Laterality Date  . CHOLECYSTECTOMY      No current outpatient prescriptions on file prior to visit.   No current facility-administered medications on file prior to visit.     No Known Allergies  Social History   Social History  . Marital status: Single    Spouse name: N/A  . Number of children: N/A  . Years of education: N/A   Occupational History  . Not on file.   Social History Main Topics  . Smoking status: Never Smoker  . Smokeless tobacco: Never Used  . Alcohol use Yes     Comment: occas  . Drug use: No  . Sexual activity: Yes    Birth control/ protection: None   Other Topics Concern  . Not on file   Social History Narrative  . No narrative on file    Family  History  Problem Relation Age of Onset  . Diabetes Maternal Grandmother   . Breast cancer Neg Hx   . Ovarian cancer Neg Hx   . Colon cancer Neg Hx   . Heart disease Neg Hx     The following portions of the patient's history were reviewed and updated as appropriate: allergies, current medications, past family history, past medical history, past social history, past surgical history and problem list.  Review of Systems Review of Systems - Comprehensive review of systems is negative  Objective:   BP 100/63   Pulse 76   Ht 5\' 2"  (1.575 m)   Wt 155 lb 3.2 oz (70.4 kg)   LMP 10/01/2016 (Approximate)   BMI 28.39 kg/m  CONSTITUTIONAL: Well-developed, well-nourished female in no acute distress.  HENT:  Normocephalic, atraumatic.  NECK: Not examined SKIN: Skin is warm and dry. No rash noted. Not diaphoretic. No erythema. No pallor. NEUROLGIC: Alert and oriented to person, place, and time. PSYCHIATRIC: Normal mood and affect. Normal behavior. Normal judgment and thought content. CARDIOVASCULAR:Not Examined RESPIRATORY: Not Examined BREASTS: Not Examined ABDOMEN: Soft, non distended; Non tender.  No Organomegaly. PELVIC:  External Genitalia: Normal  BUS: Normal  Vagina: Normal  Cervix: Normal; minimal bloody mucus noted at os; Os closed; no cervical motion tenderness  Uterus: Normal size, shape,consistency, mobile, nonenlarged, nontender  Adnexa: No palpable masses; minimal right lower quadrant tenderness; no left adnexal tenderness  RV: Normal external exam  Bladder: Nontender MUSCULOSKELETAL: Normal range of motion. No tenderness.  No cyanosis, clubbing, or edema.     Assessment:   1. Threatened abortion; regnancy is likely nonviable due to history of 17 days of bleeding without passage of tissue and abnormal hCG titer trend, and negative ultrasound for IUP or adnexal pathology. Cannot rule out ectopic. O+ blood type-no need for RhoGAM - Beta HCG, Quant     Plan:   1.  Quantitative hCG today 2. Return in 1 week for follow-up and repeat quantitative hCG 3. If hCG titer it plateaus or slowly rises, suction D&C with laparoscopy will be offered versus empiric methotrexate therapy 4. Ectopic precautions given. Return immediately for onset of severe right lower quadrant or left lower quadrant pain  Herold Harms, MD  Note: This dictation was prepared with Dragon dictation along with smaller phrase technology. Any transcriptional errors that result from this process are unintentional.

## 2016-12-01 NOTE — Patient Instructions (Signed)
1. Obtain quantitative hCG blood test today 2. Return in 1 week for follow-up 3. Return immediately if you develop severe right sided or left-sided abdominal pelvic pain

## 2016-12-02 LAB — BETA HCG QUANT (REF LAB): HCG QUANT: 66 m[IU]/mL

## 2016-12-06 ENCOUNTER — Encounter: Payer: Self-pay | Admitting: Obstetrics and Gynecology

## 2016-12-06 ENCOUNTER — Ambulatory Visit (INDEPENDENT_AMBULATORY_CARE_PROVIDER_SITE_OTHER): Payer: Commercial Managed Care - PPO | Admitting: Obstetrics and Gynecology

## 2016-12-06 VITALS — BP 108/65 | HR 99 | Ht 62.0 in | Wt 154.5 lb

## 2016-12-06 DIAGNOSIS — O2691 Pregnancy related conditions, unspecified, first trimester: Secondary | ICD-10-CM

## 2016-12-06 DIAGNOSIS — O200 Threatened abortion: Principal | ICD-10-CM

## 2016-12-06 HISTORY — DX: Pregnancy related conditions, unspecified, first trimester: O26.91

## 2016-12-06 NOTE — Addendum Note (Signed)
Addended by: Marchelle FolksMILLER, Jerrold Haskell G on: 12/06/2016 10:37 AM   Modules accepted: Orders

## 2016-12-06 NOTE — Progress Notes (Signed)
Chief complaint: 1. Threatened abortion 2. Abnormal pregnancy  Patient presents for follow-up regarding threatened abortion, possible ectopic pregnancy. Serial quantitative hCG titers have a subnormal rise (16-10-96(55-58-66) She notes some mid abdominal cramping without lateralization of pain; she associates this abdomen discomfort with recent food intake. Vaginal spotting persists.  BP 108/65   Pulse 99   Ht 5\' 2"  (1.575 m)   Wt 154 lb 8 oz (70.1 kg)   LMP 10/01/2016 (Approximate)   BMI 28.26 kg/m  Well-appearing female in no acute distress. Abdomen: Nontender without peritoneal signs  ASSESSMENT: 1. Abnormal pregnancy, likely ectopic 2. No evidence of abnormality on pelvic ultrasound 1 week ago 3. No acute change in symptomatology 4. Abnormal persistent quantitative hCG rise  PLAN: 1.  Extensive discussion regarding management is completed-suction D&C/laparoscopy versus methotrexate therapy. 2.  Patient is to contemplate options and let us know if and 24 hours regarding choice of management. 3. Acute ectopic precautions given 4. If patient chooses methotrexate therapy, [facility] for expedited management of this condition.  A total of 25 minutes were spent face-to-face with the patient during this encounter and over half of that time involved counseling and coordination of care.  Herold HarmsMartin A Meriem Lemieux, MD  Note: This dictation was prepared with Dragon dictation along with smaller phrase technology. Any transcriptional errors that result from this process are unintentional.

## 2016-12-06 NOTE — Patient Instructions (Signed)
1. Call office on 12/07/2016 regarding decision of management for ectopic pregnancy (surgery versus methotrexate therapy)   Ectopic Pregnancy An ectopic pregnancy is when the fertilized egg attaches (implants) outside the uterus. Most ectopic pregnancies occur in one of the tubes where eggs travel from the ovary to the uterus (fallopian tubes), but the implanting can occur in other locations. In rare cases, ectopic pregnancies occur on the ovary, intestine, pelvis, abdomen, or cervix. In an ectopic pregnancy, the fertilized egg does not have the ability to develop into a normal, healthy baby. A ruptured ectopic pregnancy is one in which tearing or bursting of a fallopian tube causes internal bleeding. Often, there is intense lower abdominal pain, and vaginal bleeding sometimes occurs. Having an ectopic pregnancy can be life-threatening. If this dangerous condition is not treated, it can lead to blood loss, shock, or even death. What are the causes? The most common cause of this condition is damage to one of the fallopian tubes. A fallopian tube may be narrowed or blocked, and that keeps the fertilized egg from reaching the uterus. What increases the risk? This condition is more likely to develop in women of childbearing age who have different levels of risk. The levels of risk can be divided into three categories. High risk  You have gone through infertility treatment.  You have had an ectopic pregnancy before.  You have had surgery on the fallopian tubes, or another surgical procedure, such as an abortion.  You have had surgery to have the fallopian tubes tied (tubal ligation).  You have problems or diseases of the fallopian tubes.  You have been exposed to diethylstilbestrol (DES). This medicine was used until 1971, and it had effects on babies whose mothers took the medicine.  You become pregnant while using an IUD (intrauterine device) for birth control. Moderate risk  You have a  history of infertility.  You have had an STI (sexually transmitted infection).  You have a history of pelvic inflammatory disease (PID).  You have scarring from endometriosis.  You have multiple sexual partners.  You smoke. Low risk  You have had pelvic surgery.  You use vaginal douches.  You became sexually active before age 89. What are the signs or symptoms? Common symptoms of this condition include normal pregnancy symptoms, such as missing a period, nausea, tiredness, abdominal pain, breast tenderness, and bleeding. However, ectopic pregnancy will have additional symptoms, such as:  Pain with intercourse.  Irregular vaginal bleeding or spotting.  Cramping or pain on one side or in the lower abdomen.  Fast heartbeat, low blood pressure, and sweating.  Passing out while having a bowel movement. Symptoms of a ruptured ectopic pregnancy and internal bleeding may include:  Sudden, severe pain in the abdomen and pelvis.  Dizziness, weakness, light-headedness, or fainting.  Pain in the shoulder or neck area. How is this diagnosed? This condition is diagnosed by:  A pelvic exam to locate pain or a mass in the abdomen.  A pregnancy test. This blood test checks for the presence as well as the specific level of pregnancy hormone in the bloodstream.  Ultrasound. This is performed if a pregnancy test is positive. In this test, a probe is inserted into the vagina. The probe will detect a fetus, possibly in a location other than the uterus.  Taking a sample of uterus tissue (dilation and curettage, or D&C).  Surgery to perform a visual exam of the inside of the abdomen using a thin, lighted tube that has a tiny  camera on the end (laparoscope).  Culdocentesis. This procedure involves inserting a needle at the top of the vagina, behind the uterus. If blood is present in this area, it may indicate that a fallopian tube is torn. How is this treated? This condition is treated  with medicine or surgery. Medicine  An injection of a medicine (methotrexate) may be given to cause the pregnancy tissue to be absorbed. This medicine may save your fallopian tube. It may be given if:  The diagnosis is made early, with no signs of active bleeding.  The fallopian tube has not ruptured.  You are considered to be a good candidate for the medicine. Usually, pregnancy hormone blood levels are checked after methotrexate treatment. This is to be sure that the medicine is effective. It may take 4-6 weeks for the pregnancy to be absorbed. Most pregnancies will be absorbed by 3 weeks. Surgery  A laparoscope may be used to remove the pregnancy tissue.  If severe internal bleeding occurs, a larger cut (incision) may be made in the lower abdomen (laparotomy) to remove the fetus and placenta. This is done to stop the bleeding.  Part or all of the fallopian tube may be removed (salpingectomy) along with the fetus and placenta. The fallopian tube may also be repaired during the surgery.  In very rare circumstances, removal of the uterus (hysterectomy) may be required.  After surgery, pregnancy hormone testing may be done to be sure that there is no pregnancy tissue left. Whether your treatment is medicine or surgery, you may receive a Rho (D) immune globulin shot to prevent problems with any future pregnancy. This shot may be given if:  You are Rh-negative and the baby's father is Rh-positive.  You are Rh-negative and you do not know the Rh type of the baby's father. Follow these instructions at home:  Rest and limit your activity after the procedure for as long as told by your health care provider.  Until your health care provider says that it is safe:  Do not lift anything that is heavier than 10 lb (4.5 kg), or the limit that your health care provider tells you.  Avoid physical exercise and any movement that requires effort (is strenuous).  To help prevent  constipation:  Eat a healthy diet that includes fruits, vegetables, and whole grains.  Drink 6-8 glasses of water per day. Get help right away if:  You develop worsening pain that is not relieved by medicine.  You have:  A fever or chills.  Vaginal bleeding.  Redness and swelling at the incision site.  Nausea and vomiting.  You feel dizzy or weak.  You feel light-headed or you faint. This information is not intended to replace advice given to you by your health care provider. Make sure you discuss any questions you have with your health care provider. Document Released: 12/22/2004 Document Revised: 07/13/2016 Document Reviewed: 06/15/2016 Elsevier Interactive Patient Education  2017 [employer]

## 2016-12-08 ENCOUNTER — Encounter
Admission: RE | Admit: 2016-12-08 | Discharge: 2016-12-08 | Disposition: A | Discharge status: Home / Self Care | Payer: Commercial Managed Care - PPO | Source: Ambulatory Visit | Attending: Obstetrics and Gynecology | Admitting: Obstetrics and Gynecology

## 2016-12-08 ENCOUNTER — Ambulatory Visit (INDEPENDENT_AMBULATORY_CARE_PROVIDER_SITE_OTHER): Payer: Commercial Managed Care - PPO | Admitting: Obstetrics and Gynecology

## 2016-12-08 ENCOUNTER — Encounter: Payer: Self-pay | Admitting: Obstetrics and Gynecology

## 2016-12-08 VITALS — BP 96/66 | HR 85 | Ht 62.0 in | Wt 151.4 lb

## 2016-12-08 DIAGNOSIS — O2691 Pregnancy related conditions, unspecified, first trimester: Secondary | ICD-10-CM

## 2016-12-08 DIAGNOSIS — Z01818 Encounter for other preprocedural examination: Principal | ICD-10-CM

## 2016-12-08 DIAGNOSIS — O021 Missed abortion: Secondary | ICD-10-CM | POA: Diagnosis not present

## 2016-12-08 DIAGNOSIS — Z3A01 Less than 8 weeks gestation of pregnancy: Secondary | ICD-10-CM | POA: Diagnosis not present

## 2016-12-08 DIAGNOSIS — O00101 Right tubal pregnancy without intrauterine pregnancy: Secondary | ICD-10-CM | POA: Diagnosis present

## 2016-12-08 DIAGNOSIS — N888 Other specified noninflammatory disorders of cervix uteri: Secondary | ICD-10-CM | POA: Diagnosis not present

## 2016-12-08 DIAGNOSIS — K661 Hemoperitoneum: Secondary | ICD-10-CM | POA: Diagnosis not present

## 2016-12-08 HISTORY — DX: Other specified health status: Z78.9

## 2016-12-08 HISTORY — DX: Adverse effect of unspecified anesthetic, initial encounter: T41.45XA

## 2016-12-08 HISTORY — DX: Other complications of anesthesia, initial encounter: T88.59XA

## 2016-12-08 LAB — CBC WITH DIFFERENTIAL/PLATELET
Basophils Absolute: 0 10*3/uL (ref 0–0.1)
Basophils Relative: 1 %
Eosinophils Absolute: 0.2 10*3/uL (ref 0–0.7)
Eosinophils Relative: 3 %
HEMATOCRIT: 39.5 % (ref 35.0–47.0)
Hemoglobin: 13.4 g/dL (ref 12.0–16.0)
LYMPHS ABS: 2.6 10*3/uL (ref 1.0–3.6)
LYMPHS PCT: 36 %
MCH: 30 pg (ref 26.0–34.0)
MCHC: 34 g/dL (ref 32.0–36.0)
MCV: 88.3 fL (ref 80.0–100.0)
Monocytes Absolute: 0.9 10*3/uL (ref 0.2–0.9)
Monocytes Relative: 12 %
NEUTROS ABS: 3.4 10*3/uL (ref 1.4–6.5)
Neutrophils Relative %: 48 %
Platelets: 256 10*3/uL (ref 150–440)
RBC: 4.48 MIL/uL (ref 3.80–5.20)
RDW: 13.5 % (ref 11.5–14.5)
WBC: 7.2 10*3/uL (ref 3.6–11.0)

## 2016-12-08 LAB — TYPE AND SCREEN
ABO/RH(D): O POS
ANTIBODY SCREEN: NEGATIVE
EXTEND SAMPLE REASON: UNDETERMINED

## 2016-12-08 LAB — RAPID HIV SCREEN (HIV 1/2 AB+AG)
HIV 1/2 Antibodies: NONREACTIVE
HIV-1 P24 Antigen - HIV24: NONREACTIVE

## 2016-12-08 LAB — HCG, QUANTITATIVE, PREGNANCY: HCG, BETA CHAIN, QUANT, S: 100 m[IU]/mL — AB (ref <5)

## 2016-12-08 NOTE — Patient Instructions (Signed)
1. Return in 1 week after surgery for postop check 

## 2016-12-08 NOTE — Progress Notes (Signed)
Subjective:  PREOPERATIVE HISTORY AND PHYSICAL    Date of surgery: 12/09/2016  Procedures:  Suction D&C  Laparoscopy with removal of ectopic Diagnoses:  Abnormal pregnancy in first trimester  Probable ectopic pregnancy   Patient is a 26 y.o. G1P0010female scheduled for suction D&C and laparoscopy for removal of ectopic pregnancy on 12/09/2016.  The patient remains asymptomatic except for some mild mid pelvic cramping. Persistent light spotting is noted. There has been no passage of tissue. Quantitative hCG trend has been abnormal (55-58-66) Pelvic ultrasound has demonstrated nothing in the uterus or adnexa.  Ultrasound: 11/22/2016 CLINICAL DATA:  Pelvic pain and vaginal bleeding. Early pregnancy. Quantitative beta HCG 58. Gestational age by last menstrual period 7 weeks 3 days.  EXAM: OBSTETRIC <14 WK US AND TRANSVAGINAL OB US  TECHNIQUE: Both transabdominal and transvaginal ultrasound examinations were performed for complete evaluation of the gestation as well as the maternal uterus, adnexal regions, and pelvic cul-de-sac. Transvaginal technique was performed to assess early pregnancy.  COMPARISON:  None.  FINDINGS: Intrauterine gestational sac: Absent  Yolk sac:  Absent  Embryo:  Absent  Cardiac Activity:  Subchorionic hemorrhage:  None visualized.  Maternal uterus/adnexae: Endometrial thickness 8 mm. Uterine length 8.3 cm. Small nabothian cyst noted.  Right ovary 3.8 by 2.3 by 1.7 cm. Left ovary 3.0 by 1.6 by 2.3 cm. Both ovaries appear normal and have expected color flow internally. No adnexal mass or free pelvic fluid.  IMPRESSION: 1. No visible gestational sac or adnexal mass. No free pelvic fluid. Differential diagnostic considerations include very early pregnancy or prior spontaneous abortion. Correlate with quantitative beta HCG trend and follow up.  HCG titers: 11/22/2016 58 11/25/2016 56 12/01/2016 66  ABO/Rh: O+   OB History    Gravida Para Term Preterm AB Living   1       1     SAB TAB Ectopic Multiple Live Births   1              Patient's last menstrual period was 10/01/2016 (approximate).    History reviewed. No pertinent past medical history.  Past Surgical History:  Procedure Laterality Date  . CHOLECYSTECTOMY      OB History  Gravida Para Term Preterm AB Living  1       1    SAB TAB Ectopic Multiple Live Births  1            # Outcome Date GA Lbr Len/2nd Weight Sex Delivery Anes PTL Lv  1 SAB 2017              Social History   Social History  . Marital status: Single    Spouse name: N/A  . Number of children: N/A  . Years of education: N/A   Social History Main Topics  . Smoking status: Never Smoker  . Smokeless tobacco: Never Used  . Alcohol use Yes     Comment: occas  . Drug use: No  . Sexual activity: Yes    Birth control/ protection: None   Other Topics Concern  . None   Social History Narrative  . None    Family History  Problem Relation Age of Onset  . Diabetes Maternal Grandmother   . Breast cancer Neg Hx   . Ovarian cancer Neg Hx   . Colon cancer Neg Hx   . Heart disease Neg Hx      (Not in a hospital admission)  No Known Allergies  Review of Systems Constitutional: No recent fever/chills/sweats   Respiratory: No recent cough/bronchitis Cardiovascular: No chest pain Gastrointestinal: No recent nausea/vomiting/diarrhea Genitourinary: No UTI symptoms Hematologic/lymphatic:No history of coagulopathy or recent blood thinner use    Objective:    BP 96/66   Pulse 85   Ht 5' 2" (1.575 m)   Wt 151 lb 6.4 oz (68.7 kg)   LMP 10/01/2016 (Approximate)   BMI 27.69 kg/m   General:   Normal  Skin:   normal  HEENT:  Normal  Neck:  Supple without Adenopathy or Thyromegaly  Lungs:   Heart:              Breasts:   Abdomen:  Pelvis:  M/S   Extremeties:  Neuro:    clear to auscultation bilaterally   Normal without murmur   Not Examined   soft,  non-tender; bowel sounds normal; no masses,  no organomegaly   Exam deferred to OR  No CVAT  Warm/Dry   Normal         12/01/2016 PELVIC:             External Genitalia: Normal             BUS: Normal             Vagina: Normal             Cervix: Normal; minimal bloody mucus noted at os; Os closed; no cervical motion tenderness             Uterus: Normal size, shape,consistency, mobile, nonenlarged, nontender             Adnexa: No palpable masses; minimal right lower quadrant tenderness; no left adnexal tenderness             RV: Normal external exam             Bladder: Nontender Assessment:    Abnormal first trimester pregnancy, cannot rule out ectopic   Plan:   1. Suction D&C 2. Laparoscopy with removal of ectopic pregnancy  PREOPERATIVE counseling: The patient is to undergo procedure for abnormal first trimester pregnancy. She is understanding of the procedures and is aware of and is accepting of all surgical risks which include but are not limited to bleeding,, pelvic organ injury with need for repair, blood clot disorders, anesthesia risks, etc. All questions have been answered. Informed consent is given. Patient is ready and willing to proceed with surgery as scheduled.  Paizley Ramella A Ajiah Mcglinn, MD  Note: This dictation was prepared with Dragon dictation along with smaller phrase technology. Any transcriptional errors that result from this process are unintentional.   

## 2016-12-08 NOTE — H&P (Signed)
Subjective:  PREOPERATIVE HISTORY AND PHYSICAL    Date of surgery: 12/09/2016  Procedures:  Suction D&C  Laparoscopy with removal of ectopic Diagnoses:  Abnormal pregnancy in first trimester  Probable ectopic pregnancy   Patient is a 26 y.o. G1P00510female scheduled for suction D&C and laparoscopy for removal of ectopic pregnancy on 12/09/2016.  The patient remains asymptomatic except for some mild mid pelvic cramping. Persistent light spotting is noted. There has been no passage of tissue. Quantitative hCG trend has been abnormal (40-98-11(55-58-66) Pelvic ultrasound has demonstrated nothing in the uterus or adnexa.  Ultrasound: 11/22/2016 CLINICAL DATA:  Pelvic pain and vaginal bleeding. Early pregnancy. Quantitative beta HCG 58. Gestational age by last menstrual period 7 weeks 3 days.  EXAM: OBSTETRIC <14 WK US AND TRANSVAGINAL OB US  TECHNIQUE: Both transabdominal and transvaginal ultrasound examinations were performed for complete evaluation of the gestation as well as the maternal uterus, adnexal regions, and pelvic cul-de-sac. Transvaginal technique was performed to assess early pregnancy.  COMPARISON:  None.  FINDINGS: Intrauterine gestational sac: Absent  Yolk sac:  Absent  Embryo:  Absent  Cardiac Activity:  Subchorionic hemorrhage:  None visualized.  Maternal uterus/adnexae: Endometrial thickness 8 mm. Uterine length 8.3 cm. Small nabothian cyst noted.  Right ovary 3.8 by 2.3 by 1.7 cm. Left ovary 3.0 by 1.6 by 2.3 cm. Both ovaries appear normal and have expected color flow internally. No adnexal mass or free pelvic fluid.  IMPRESSION: 1. No visible gestational sac or adnexal mass. No free pelvic fluid. Differential diagnostic considerations include very early pregnancy or prior spontaneous abortion. Correlate with quantitative beta HCG trend and follow up.  HCG titers: 11/22/2016 58 11/25/2016 56 12/01/2016 66  ABO/Rh: O+   OB History    Gravida Para Term Preterm AB Living   1       1     SAB TAB Ectopic Multiple Live Births   1              Patient's last menstrual period was 10/01/2016 (approximate).    History reviewed. No pertinent past medical history.  Past Surgical History:  Procedure Laterality Date  . CHOLECYSTECTOMY      OB History  Gravida Para Term Preterm AB Living  1       1    SAB TAB Ectopic Multiple Live Births  1            # Outcome Date GA Lbr Len/2nd Weight Sex Delivery Anes PTL Lv  1 SAB 2017              Social History   Social History  . Marital status: Single    Spouse name: N/A  . Number of children: N/A  . Years of education: N/A   Social History Main Topics  . Smoking status: Never Smoker  . Smokeless tobacco: Never Used  . Alcohol use Yes     Comment: occas  . Drug use: No  . Sexual activity: Yes    Birth control/ protection: None   Other Topics Concern  . None   Social History Narrative  . None    Family History  Problem Relation Age of Onset  . Diabetes Maternal Grandmother   . Breast cancer Neg Hx   . Ovarian cancer Neg Hx   . Colon cancer Neg Hx   . Heart disease Neg Hx      (Not in a hospital admission)  No Known Allergies  Review of Systems Constitutional: No recent fever/chills/sweats  Respiratory: No recent cough/bronchitis Cardiovascular: No chest pain Gastrointestinal: No recent nausea/vomiting/diarrhea Genitourinary: No UTI symptoms Hematologic/lymphatic:No history of coagulopathy or recent blood thinner use    Objective:    BP 96/66   Pulse 85   Ht 5\' 2"  (1.575 m)   Wt 151 lb 6.4 oz (68.7 kg)   LMP 10/01/2016 (Approximate)   BMI 27.69 kg/m   General:   Normal  Skin:   normal  HEENT:  Normal  Neck:  Supple without Adenopathy or Thyromegaly  Lungs:   Heart:              Breasts:   Abdomen:  Pelvis:  M/S   Extremeties:  Neuro:    clear to auscultation bilaterally   Normal without murmur   Not Examined   soft,  non-tender; bowel sounds normal; no masses,  no organomegaly   Exam deferred to OR  No CVAT  Warm/Dry   Normal         12/01/2016 PELVIC:             External Genitalia: Normal             BUS: Normal             Vagina: Normal             Cervix: Normal; minimal bloody mucus noted at os; Os closed; no cervical motion tenderness             Uterus: Normal size, shape,consistency, mobile, nonenlarged, nontender             Adnexa: No palpable masses; minimal right lower quadrant tenderness; no left adnexal tenderness             RV: Normal external exam             Bladder: Nontender Assessment:    Abnormal first trimester pregnancy, cannot rule out ectopic   Plan:   1. Suction D&C 2. Laparoscopy with removal of ectopic pregnancy  PREOPERATIVE counseling: The patient is to undergo procedure for abnormal first trimester pregnancy. She is understanding of the procedures and is aware of and is accepting of all surgical risks which include but are not limited to bleeding,, pelvic organ injury with need for repair, blood clot disorders, anesthesia risks, etc. All questions have been answered. Informed consent is given. Patient is ready and willing to proceed with surgery as scheduled.  Herold Harms, MD  Note: This dictation was prepared with Dragon dictation along with smaller phrase technology. Any transcriptional errors that result from this process are unintentional.

## 2016-12-08 NOTE — Patient Instructions (Signed)
  Your procedure is scheduled on: 12/09/16 Fri Report to Same Day Surgery 2nd floor medical mall [facility](Medical Mall Entrance-take elevator on left to 2nd floor.  Check in with surgery information desk.) at 11:30 am   Remember: Instructions that are not followed completely may result in serious medical risk, up to and including death, or upon the discretion of your surgeon and anesthesiologist your surgery may need to be rescheduled.    _x___ 1. Do not eat food or drink liquids after midnight. No gum chewing or hard candies.     __x__ 2. No Alcohol for 24 hours before or after surgery.   __x__3. No Smoking for 24 prior to surgery.   ____  4. Bring all medications with you on the day of surgery if instructed.    __x__ 5. Notify your doctor if there is any change in your medical condition     (cold, fever, infections).     Do not wear jewelry, make-up, hairpins, clips or nail polish.  Do not wear lotions, powders, or perfumes. You may wear deodorant.  Do not shave 48 hours prior to surgery. Men may shave face and neck.  Do not bring valuables to the hospital.    [facility] is not responsible for any belongings or valuables.               Contacts, dentures or bridgework may not be worn into surgery.  Leave your suitcase in the car. After surgery it may be brought to your room.  For patients admitted to the hospital, discharge time is determined by your treatment team.   Patients discharged the day of surgery will not be allowed to drive home.  You will need someone to drive you home and stay with you the night of your procedure.    Please read over the following fact sheets that you were given:   [facility] Preparing [facility] and or MRSA Information   _x___ Take these medicines the morning of surgery with A SIP OF WATER:    1. None 2.  3.  4.  5.  6.  ____Fleets enema or Magnesium Citrate as directed.   _x___ Use CHG Soap or sage wipes as directed on instruction sheet   ____  Use inhalers on the day of surgery and bring to hospital day of surgery  ____ Stop metformin 2 days prior to surgery    ____ Take 1/2 of usual insulin dose the night before surgery and none on the morning of           surgery.   ____ Stop Aspirin, Coumadin, Pllavix ,Eliquis, Effient, or Pradaxa  x__ Stop Anti-inflammatories such as Advil, Aleve, Ibuprofen, Motrin, Naproxen,          Naprosyn, Goodies powders or aspirin products. Ok to take Tylenol.   ____ Stop supplements until after surgery.    ____ Bring C-Pap to the hospital.

## 2016-12-09 ENCOUNTER — Encounter: Payer: Self-pay | Admitting: *Deleted

## 2016-12-09 ENCOUNTER — Ambulatory Visit
Admission: RE | Admit: 2016-12-09 | Discharge: 2016-12-09 | Disposition: A | Discharge status: Home / Self Care | Payer: Commercial Managed Care - PPO | Source: Ambulatory Visit | Attending: Obstetrics and Gynecology | Admitting: Obstetrics and Gynecology

## 2016-12-09 ENCOUNTER — Inpatient Hospital Stay: Payer: Commercial Managed Care - PPO | Admitting: Anesthesiology

## 2016-12-09 ENCOUNTER — Encounter
Admission: RE | Disposition: A | Discharge status: Home / Self Care | Payer: Self-pay | Source: Ambulatory Visit | Attending: Obstetrics and Gynecology

## 2016-12-09 DIAGNOSIS — K661 Hemoperitoneum: Secondary | ICD-10-CM

## 2016-12-09 DIAGNOSIS — Z9889 Other specified postprocedural states: Secondary | ICD-10-CM

## 2016-12-09 DIAGNOSIS — N888 Other specified noninflammatory disorders of cervix uteri: Secondary | ICD-10-CM

## 2016-12-09 DIAGNOSIS — O00101 Right tubal pregnancy without intrauterine pregnancy: Principal | ICD-10-CM

## 2016-12-09 DIAGNOSIS — O009 Unspecified ectopic pregnancy without intrauterine pregnancy: Secondary | ICD-10-CM

## 2016-12-09 DIAGNOSIS — O021 Missed abortion: Secondary | ICD-10-CM

## 2016-12-09 DIAGNOSIS — Z3A01 Less than 8 weeks gestation of pregnancy: Secondary | ICD-10-CM

## 2016-12-09 HISTORY — PX: DILATION AND EVACUATION: SHX1459

## 2016-12-09 HISTORY — PX: LAPAROSCOPY: SHX197

## 2016-12-09 LAB — RPR: RPR Ser Ql: NONREACTIVE

## 2016-12-09 LAB — POCT PREGNANCY, URINE: PREG TEST UR: NEGATIVE

## 2016-12-09 SURGERY — DILATION AND EVACUATION, UTERUS
Anesthesia: General

## 2016-12-09 MED ORDER — LIDOCAINE HCL (PF) 1 % IJ SOLN
INTRAMUSCULAR | Status: AC
  Filled 2016-12-09: qty 5

## 2016-12-09 MED ORDER — SUGAMMADEX SODIUM 200 MG/2ML IV SOLN
INTRAVENOUS | Status: DC | PRN
  Administered 2016-12-09: 140 mg via INTRAVENOUS

## 2016-12-09 MED ORDER — EPINEPHRINE PF 1 MG/ML IJ SOLN
INTRAMUSCULAR | Status: AC
  Filled 2016-12-09: qty 1

## 2016-12-09 MED ORDER — FENTANYL CITRATE (PF) 100 MCG/2ML IJ SOLN
INTRAMUSCULAR | Status: DC | PRN
  Administered 2016-12-09: 100 ug via INTRAVENOUS

## 2016-12-09 MED ORDER — FENTANYL CITRATE (PF) 100 MCG/2ML IJ SOLN
INTRAMUSCULAR | Status: AC
  Filled 2016-12-09: qty 2

## 2016-12-09 MED ORDER — ROCURONIUM BROMIDE 50 MG/5ML IV SOSY
PREFILLED_SYRINGE | INTRAVENOUS | Status: AC
  Filled 2016-12-09: qty 5

## 2016-12-09 MED ORDER — OXYCODONE HCL 5 MG PO TABS
ORAL_TABLET | ORAL | Status: AC
  Filled 2016-12-09: qty 1

## 2016-12-09 MED ORDER — PROPOFOL 10 MG/ML IV BOLUS
INTRAVENOUS | Status: AC
  Filled 2016-12-09: qty 20

## 2016-12-09 MED ORDER — SUCCINYLCHOLINE CHLORIDE 200 MG/10ML IV SOSY
PREFILLED_SYRINGE | INTRAVENOUS | Status: AC
  Filled 2016-12-09: qty 10

## 2016-12-09 MED ORDER — LACTATED RINGERS IV SOLN
INTRAVENOUS | Status: DC
  Administered 2016-12-09: 125 mL/h via INTRAVENOUS
  Administered 2016-12-09: 14:00:00 via INTRAVENOUS

## 2016-12-09 MED ORDER — LACTATED RINGERS IV SOLN: INTRAVENOUS | Status: DC

## 2016-12-09 MED ORDER — BUPIVACAINE HCL (PF) 0.5 % IJ SOLN
INTRAMUSCULAR | Status: AC
  Filled 2016-12-09: qty 30

## 2016-12-09 MED ORDER — KETOROLAC TROMETHAMINE 30 MG/ML IJ SOLN
INTRAMUSCULAR | Status: AC
  Filled 2016-12-09: qty 1

## 2016-12-09 MED ORDER — OXYCODONE HCL 5 MG PO TABS
5.0000 mg | ORAL_TABLET | Freq: Once | ORAL | Status: AC | PRN
  Administered 2016-12-09: 5 mg via ORAL

## 2016-12-09 MED ORDER — IBUPROFEN 600 MG PO TABS: 600.0000 mg | ORAL_TABLET | Freq: Four times a day (QID) | ORAL | 0 refills | Status: DC | PRN

## 2016-12-09 MED ORDER — FAMOTIDINE 20 MG PO TABS
20.0000 mg | ORAL_TABLET | Freq: Once | ORAL | Status: AC
  Administered 2016-12-09: 20 mg via ORAL

## 2016-12-09 MED ORDER — DEXAMETHASONE SODIUM PHOSPHATE 10 MG/ML IJ SOLN
INTRAMUSCULAR | Status: AC
  Filled 2016-12-09: qty 1

## 2016-12-09 MED ORDER — MIDAZOLAM HCL 5 MG/5ML IJ SOLN
INTRAMUSCULAR | Status: DC | PRN
  Administered 2016-12-09: 2 mg via INTRAVENOUS

## 2016-12-09 MED ORDER — ROCURONIUM BROMIDE 100 MG/10ML IV SOLN
INTRAVENOUS | Status: DC | PRN
  Administered 2016-12-09: 5 mg via INTRAVENOUS
  Administered 2016-12-09: 10 mg via INTRAVENOUS

## 2016-12-09 MED ORDER — FENTANYL CITRATE (PF) 100 MCG/2ML IJ SOLN
25.0000 ug | INTRAMUSCULAR | Status: DC | PRN
  Administered 2016-12-09 (×2): 25 ug via INTRAVENOUS

## 2016-12-09 MED ORDER — OXYCODONE HCL 5 MG/5ML PO SOLN: 5.0000 mg | Freq: Once | ORAL | Status: AC | PRN

## 2016-12-09 MED ORDER — LIDOCAINE HCL (CARDIAC) 20 MG/ML IV SOLN
INTRAVENOUS | Status: DC | PRN
  Administered 2016-12-09: 60 mg via INTRAVENOUS

## 2016-12-09 MED ORDER — ONDANSETRON HCL 4 MG/2ML IJ SOLN
INTRAMUSCULAR | Status: AC
  Filled 2016-12-09: qty 2

## 2016-12-09 MED ORDER — IBUPROFEN 600 MG PO TABS
600.0000 mg | ORAL_TABLET | Freq: Four times a day (QID) | ORAL | Status: DC | PRN
  Filled 2016-12-09: qty 1

## 2016-12-09 MED ORDER — MIDAZOLAM HCL 2 MG/2ML IJ SOLN
INTRAMUSCULAR | Status: AC
  Filled 2016-12-09: qty 2

## 2016-12-09 MED ORDER — ONDANSETRON HCL 4 MG/2ML IJ SOLN
4.0000 mg | Freq: Once | INTRAMUSCULAR | Status: AC
  Administered 2016-12-09: 4 mg via INTRAVENOUS

## 2016-12-09 MED ORDER — FAMOTIDINE 20 MG PO TABS
ORAL_TABLET | ORAL | Status: AC
  Administered 2016-12-09: 20 mg via ORAL
  Filled 2016-12-09: qty 1

## 2016-12-09 MED ORDER — PHENYLEPHRINE 40 MCG/ML (10ML) SYRINGE FOR IV PUSH (FOR BLOOD PRESSURE SUPPORT)
PREFILLED_SYRINGE | INTRAVENOUS | Status: AC
  Filled 2016-12-09: qty 10

## 2016-12-09 MED ORDER — OXYCODONE-ACETAMINOPHEN 5-325 MG PO TABS: 1.0000 | ORAL_TABLET | ORAL | 0 refills | Status: DC | PRN

## 2016-12-09 MED ORDER — SUCCINYLCHOLINE CHLORIDE 20 MG/ML IJ SOLN
INTRAMUSCULAR | Status: DC | PRN
  Administered 2016-12-09: 100 mg via INTRAVENOUS

## 2016-12-09 MED ORDER — PROPOFOL 10 MG/ML IV BOLUS
INTRAVENOUS | Status: DC | PRN
  Administered 2016-12-09: 150 mg via INTRAVENOUS

## 2016-12-09 MED ORDER — KETOROLAC TROMETHAMINE 30 MG/ML IJ SOLN
INTRAMUSCULAR | Status: DC | PRN
  Administered 2016-12-09: 30 mg via INTRAVENOUS

## 2016-12-09 MED ORDER — FENTANYL CITRATE (PF) 100 MCG/2ML IJ SOLN
INTRAMUSCULAR | Status: AC
  Administered 2016-12-09: 25 ug via INTRAVENOUS
  Filled 2016-12-09: qty 2

## 2016-12-09 MED ORDER — ONDANSETRON HCL 4 MG/2ML IJ SOLN
INTRAMUSCULAR | Status: DC | PRN
  Administered 2016-12-09: 4 mg via INTRAVENOUS

## 2016-12-09 MED ORDER — DEXAMETHASONE SODIUM PHOSPHATE 10 MG/ML IJ SOLN
INTRAMUSCULAR | Status: DC | PRN
  Administered 2016-12-09: 10 mg via INTRAVENOUS

## 2016-12-09 MED ORDER — OXYCODONE-ACETAMINOPHEN 5-325 MG PO TABS: 1.0000 | ORAL_TABLET | ORAL | Status: DC | PRN

## 2016-12-09 MED ORDER — ONDANSETRON HCL 4 MG/2ML IJ SOLN
INTRAMUSCULAR | Status: AC
  Administered 2016-12-09: 4 mg via INTRAVENOUS
  Filled 2016-12-09: qty 2

## 2016-12-09 SURGICAL SUPPLY — 46 items
BLADE SURG SZ11 CARB STEEL (BLADE) ×3 IMPLANT
CANISTER SUCT 1200ML W/VALVE (MISCELLANEOUS) ×3 IMPLANT
CATH ROBINSON RED A/P 16FR (CATHETERS) ×3 IMPLANT
CHLORAPREP W/TINT 26ML (MISCELLANEOUS) ×3 IMPLANT
DEFOGGER SCOPE WARMER CLEARIFY (MISCELLANEOUS) ×3 IMPLANT
DERMABOND ADVANCED (GAUZE/BANDAGES/DRESSINGS) ×4
DERMABOND ADVANCED .7 DNX12 (GAUZE/BANDAGES/DRESSINGS) ×2 IMPLANT
DRSG TELFA 3X8 NADH (GAUZE/BANDAGES/DRESSINGS) ×3 IMPLANT
GLOVE BIO SURGEON STRL SZ8 (GLOVE) ×6 IMPLANT
GLOVE INDICATOR 8.0 STRL GRN (GLOVE) ×3 IMPLANT
GOWN STRL REUS W/ TWL LRG LVL3 (GOWN DISPOSABLE) ×1 IMPLANT
GOWN STRL REUS W/ TWL XL LVL3 (GOWN DISPOSABLE) ×1 IMPLANT
GOWN STRL REUS W/TWL LRG LVL3 (GOWN DISPOSABLE) ×2
GOWN STRL REUS W/TWL XL LVL3 (GOWN DISPOSABLE) ×2
GRASPER SUT TROCAR 14GX15 (MISCELLANEOUS) ×3 IMPLANT
IRRIGATION STRYKERFLOW (MISCELLANEOUS) ×1 IMPLANT
IRRIGATOR STRYKERFLOW (MISCELLANEOUS) ×3
IV LACTATED RINGERS 1000ML (IV SOLUTION) ×3 IMPLANT
KIT BERKELEY 1ST TRIMESTER 3/8 (MISCELLANEOUS) IMPLANT
KIT PINK PAD W/HEAD ARE REST (MISCELLANEOUS) ×3
KIT PINK PAD W/HEAD ARM REST (MISCELLANEOUS) ×1 IMPLANT
KIT RM TURNOVER CYSTO AR (KITS) ×3 IMPLANT
LABEL OR SOLS (LABEL) IMPLANT
NS IRRIG 1000ML POUR BTL (IV SOLUTION) IMPLANT
NS IRRIG 500ML POUR BTL (IV SOLUTION) ×6 IMPLANT
PACK DNC HYST (MISCELLANEOUS) ×3 IMPLANT
PACK GYN LAPAROSCOPIC (MISCELLANEOUS) ×3 IMPLANT
PAD OB MATERNITY 4.3X12.25 (PERSONAL CARE ITEMS) ×3 IMPLANT
PAD PREP 24X41 OB/GYN DISP (PERSONAL CARE ITEMS) ×3 IMPLANT
POUCH ENDO CATCH 10MM SPEC (MISCELLANEOUS) IMPLANT
SCISSORS METZENBAUM CVD 33 (INSTRUMENTS) IMPLANT
SET BERKELEY SUCTION TUBING (SUCTIONS) ×3 IMPLANT
SHEARS HARMONIC ACE PLUS 36CM (ENDOMECHANICALS) ×3 IMPLANT
SLEEVE ENDOPATH XCEL 5M (ENDOMECHANICALS) ×3 IMPLANT
SOL PREP PVP 2OZ (MISCELLANEOUS) ×3
SOLUTION PREP PVP 2OZ (MISCELLANEOUS) ×1 IMPLANT
SPONGE XRAY 4X4 16PLY STRL (MISCELLANEOUS) ×3 IMPLANT
SUT MNCRL AB 4-0 PS2 18 (SUTURE) ×3 IMPLANT
SUT PLAIN 4 0 FS 2 27 (SUTURE) IMPLANT
SUT VIC AB 0 CT2 27 (SUTURE) ×3 IMPLANT
SUT VIC AB 0 UR5 27 (SUTURE) IMPLANT
TOWEL OR 17X26 4PK STRL BLUE (TOWEL DISPOSABLE) ×3 IMPLANT
TROCAR XCEL NON-BLD 5MMX100MML (ENDOMECHANICALS) ×3 IMPLANT
TUBING INSUFFLATOR HI FLOW (MISCELLANEOUS) ×3 IMPLANT
VACURETTE 10 RIGID CVD (CANNULA) ×3 IMPLANT
VACURETTE 8 RIGID CVD (CANNULA) IMPLANT

## 2016-12-09 NOTE — Anesthesia Procedure Notes (Signed)
Procedure Name: Intubation Date/Time: 12/09/2016 12:53 PM Performed by: Dionne Bucy Pre-anesthesia Checklist: Patient identified, Patient being monitored, Timeout performed, Emergency Drugs available and Suction available Patient Re-evaluated:Patient Re-evaluated prior to inductionOxygen Delivery Method: Circle system utilized Preoxygenation: Pre-oxygenation with 100% oxygen Intubation Type: IV induction Ventilation: Mask ventilation without difficulty Laryngoscope Size: Mac and 3 Grade View: Grade I Tube type: Oral Tube size: 7.0 mm Number of attempts: 1 Airway Equipment and Method: Stylet Placement Confirmation: ETT inserted through vocal cords under direct vision,  positive ETCO2 and breath sounds checked- equal and bilateral Secured at: 21 cm Tube secured with: Tape Dental Injury: Teeth and Oropharynx as per pre-operative assessment

## 2016-12-09 NOTE — Interval H&P Note (Signed)
History and Physical Interval Note:  12/09/2016 12:07 PM  Chaunda P Pascual-Serrato  has presented today for surgery, with the diagnosis of MISSED ABORTION, ECTOPIC PREGNANCY  The various methods of treatment have been discussed with the patient and family. After consideration of risks, benefits and other options for treatment, the patient has consented to  Procedure(s): DILATATION AND EVACUATION (N/A) LAPAROSCOPY OPERATIVE (N/A) as a surgical intervention .  The patient's history has been reviewed, patient examined, no change in status, stable for surgery.  I have reviewed the patient's chart and labs.  Questions were answered to the patient's satisfaction.     Daphine DeutscherMartin A Lamarco Gudiel

## 2016-12-09 NOTE — Discharge Instructions (Signed)

## 2016-12-09 NOTE — Anesthesia Preprocedure Evaluation (Signed)
Anesthesia Evaluation  Patient identified by MRN, date of birth, ID band Patient awake    Reviewed: Allergy & Precautions, H&P , NPO status , Patient's Chart, lab work & pertinent test results  History of Anesthesia Complications (+) PONV and history of anesthetic complications  Airway Mallampati: III  TM Distance: >3 FB Neck ROM: full    Dental no notable dental hx. (+) Poor Dentition, Chipped   Pulmonary neg pulmonary ROS, neg shortness of breath,    Pulmonary exam normal breath sounds clear to auscultation       Cardiovascular Exercise Tolerance: Good (-) angina(-) Past MI and (-) DOE negative cardio ROS Normal cardiovascular exam Rhythm:regular Rate:Normal     Neuro/Psych negative neurological ROS  negative psych ROS   GI/Hepatic negative GI ROS, Neg liver ROS, neg GERD  ,  Endo/Other  negative endocrine ROS  Renal/GU      Musculoskeletal   Abdominal   Peds  Hematology negative hematology ROS (+)   Anesthesia Other Findings Past Medical History: No date: Complication of anesthesia     Comment: nausea No date: Medical history non-contributory  Past Surgical History: No date: CHOLECYSTECTOMY  BMI    Body Mass Index:  27.62 kg/m      Reproductive/Obstetrics negative OB ROS                             Anesthesia Physical Anesthesia Plan  ASA: II  Anesthesia Plan: General ETT   Post-op Pain Management:    Induction:   Airway Management Planned:   Additional Equipment:   Intra-op Plan:   Post-operative Plan:   Informed Consent: I have reviewed the patients History and Physical, chart, labs and discussed the procedure including the risks, benefits and alternatives for the proposed anesthesia with the patient or authorized representative who has indicated his/her understanding and acceptance.   Dental Advisory Given  Plan Discussed with: Anesthesiologist, CRNA and  Surgeon  Anesthesia Plan Comments:         Anesthesia Quick Evaluation

## 2016-12-09 NOTE — H&P (View-Only) (Signed)
Subjective:  PREOPERATIVE HISTORY AND PHYSICAL    Date of surgery: 12/09/2016  Procedures:  Suction D&C  Laparoscopy with removal of ectopic Diagnoses:  Abnormal pregnancy in first trimester  Probable ectopic pregnancy   Patient is a 26 y.o. G1P00510female scheduled for suction D&C and laparoscopy for removal of ectopic pregnancy on 12/09/2016.  The patient remains asymptomatic except for some mild mid pelvic cramping. Persistent light spotting is noted. There has been no passage of tissue. Quantitative hCG trend has been abnormal (40-98-11(55-58-66) Pelvic ultrasound has demonstrated nothing in the uterus or adnexa.  Ultrasound: 11/22/2016 CLINICAL DATA:  Pelvic pain and vaginal bleeding. Early pregnancy. Quantitative beta HCG 58. Gestational age by last menstrual period 7 weeks 3 days.  EXAM: OBSTETRIC <14 WK US AND TRANSVAGINAL OB US  TECHNIQUE: Both transabdominal and transvaginal ultrasound examinations were performed for complete evaluation of the gestation as well as the maternal uterus, adnexal regions, and pelvic cul-de-sac. Transvaginal technique was performed to assess early pregnancy.  COMPARISON:  None.  FINDINGS: Intrauterine gestational sac: Absent  Yolk sac:  Absent  Embryo:  Absent  Cardiac Activity:  Subchorionic hemorrhage:  None visualized.  Maternal uterus/adnexae: Endometrial thickness 8 mm. Uterine length 8.3 cm. Small nabothian cyst noted.  Right ovary 3.8 by 2.3 by 1.7 cm. Left ovary 3.0 by 1.6 by 2.3 cm. Both ovaries appear normal and have expected color flow internally. No adnexal mass or free pelvic fluid.  IMPRESSION: 1. No visible gestational sac or adnexal mass. No free pelvic fluid. Differential diagnostic considerations include very early pregnancy or prior spontaneous abortion. Correlate with quantitative beta HCG trend and follow up.  HCG titers: 11/22/2016 58 11/25/2016 56 12/01/2016 66  ABO/Rh: O+   OB History    Gravida Para Term Preterm AB Living   1       1     SAB TAB Ectopic Multiple Live Births   1              Patient's last menstrual period was 10/01/2016 (approximate).    History reviewed. No pertinent past medical history.  Past Surgical History:  Procedure Laterality Date  . CHOLECYSTECTOMY      OB History  Gravida Para Term Preterm AB Living  1       1    SAB TAB Ectopic Multiple Live Births  1            # Outcome Date GA Lbr Len/2nd Weight Sex Delivery Anes PTL Lv  1 SAB 2017              Social History   Social History  . Marital status: Single    Spouse name: N/A  . Number of children: N/A  . Years of education: N/A   Social History Main Topics  . Smoking status: Never Smoker  . Smokeless tobacco: Never Used  . Alcohol use Yes     Comment: occas  . Drug use: No  . Sexual activity: Yes    Birth control/ protection: None   Other Topics Concern  . None   Social History Narrative  . None    Family History  Problem Relation Age of Onset  . Diabetes Maternal Grandmother   . Breast cancer Neg Hx   . Ovarian cancer Neg Hx   . Colon cancer Neg Hx   . Heart disease Neg Hx      (Not in a hospital admission)  No Known Allergies  Review of Systems Constitutional: No recent fever/chills/sweats  Respiratory: No recent cough/bronchitis Cardiovascular: No chest pain Gastrointestinal: No recent nausea/vomiting/diarrhea Genitourinary: No UTI symptoms Hematologic/lymphatic:No history of coagulopathy or recent blood thinner use    Objective:    BP 96/66   Pulse 85   Ht 5\' 2"  (1.575 m)   Wt 151 lb 6.4 oz (68.7 kg)   LMP 10/01/2016 (Approximate)   BMI 27.69 kg/m   General:   Normal  Skin:   normal  HEENT:  Normal  Neck:  Supple without Adenopathy or Thyromegaly  Lungs:   Heart:              Breasts:   Abdomen:  Pelvis:  M/S   Extremeties:  Neuro:    clear to auscultation bilaterally   Normal without murmur   Not Examined   soft,  non-tender; bowel sounds normal; no masses,  no organomegaly   Exam deferred to OR  No CVAT  Warm/Dry   Normal         12/01/2016 PELVIC:             External Genitalia: Normal             BUS: Normal             Vagina: Normal             Cervix: Normal; minimal bloody mucus noted at os; Os closed; no cervical motion tenderness             Uterus: Normal size, shape,consistency, mobile, nonenlarged, nontender             Adnexa: No palpable masses; minimal right lower quadrant tenderness; no left adnexal tenderness             RV: Normal external exam             Bladder: Nontender Assessment:    Abnormal first trimester pregnancy, cannot rule out ectopic   Plan:   1. Suction D&C 2. Laparoscopy with removal of ectopic pregnancy  PREOPERATIVE counseling: The patient is to undergo procedure for abnormal first trimester pregnancy. She is understanding of the procedures and is aware of and is accepting of all surgical risks which include but are not limited to bleeding,, pelvic organ injury with need for repair, blood clot disorders, anesthesia risks, etc. All questions have been answered. Informed consent is given. Patient is ready and willing to proceed with surgery as scheduled.  Herold Harms, MD  Note: This dictation was prepared with Dragon dictation along with smaller phrase technology. Any transcriptional errors that result from this process are unintentional.

## 2016-12-09 NOTE — Transfer of Care (Signed)
Immediate Anesthesia Transfer of Care Note  Patient: Tammy Delacruz  Procedure(s) Performed: Procedure(s): DILATATION AND EVACUATION (N/A) LAPAROSCOPY WITH EXCISION TUBAL PREGNANCY (N/A)  Patient Location: PACU  Anesthesia Type:General  Level of Consciousness: sedated  Airway & Oxygen Therapy: Patient Spontanous Breathing and Patient connected to face mask oxygen  Post-op Assessment: Report given to RN and Post -op Vital signs reviewed and stable  Post vital signs: Reviewed and stable  Last Vitals:  Vitals:   12/09/16 1155 12/09/16 1418  BP: (!) 110/59 (P) 115/72  Pulse: 71 67  Resp: 16 16  Temp: 36.8 C (P) 36.6 C    Last Pain:  Vitals:   12/09/16 1155  TempSrc: Oral  PainSc: 0-No pain      Patients Stated Pain Goal: 0 (12/09/16 1155)  Complications: No apparent anesthesia complications

## 2016-12-09 NOTE — Op Note (Signed)
OPERATIVE NOTE:  Tammy Delacruz PROCEDURE DATE: 12/09/2016   PREOPERATIVE DIAGNOSIS:  1. Abnormal first trimester pregnancy 2. Suspected ectopic pregnancy  POSTOPERATIVE DIAGNOSIS:  1. Right tubal pregnancy, unruptured  PROCEDURE:  1. Suction D&C 2. Laparoscopic right partial salpingectomy   SURGEON:  Herold HarmsMartin A Donnia Poplaski, MD ASSISTANTS: None ANESTHESIA: General INDICATIONS: 26 y.o. [name] presents with the first trimester abnormal pregnancy thought to be possible ectopic. Ultrasound demonstrated nothing within the uterus. Quantitative hCG titers demonstrated a slow rise consistent with abnormal pregnancy blood type is O+.  FINDINGS:   Small hemoperitoneum less than 10 cc; right tubal pregnancy unruptured measuring 2 x 1 cm; some clot noted at the right fimbriated end of the fallopian tube; normal bilateral ovaries; normal left fallopian tube; normal uterus; normal upper abdomen   I/O's: Total I/O In: 900 [I.V.:900] Out: 75 [Urine:50; Blood:25] COUNTS:  YES SPECIMENS:  1. Endometrial curettings 2. Right tubal segment with ectopic pregnancy ANTIBIOTIC PROPHYLAXIS:N/A COMPLICATIONS: None immediate  PROCEDURE IN DETAIL: Patient was brought to the operating room and placed in the supine position. General endotracheal anesthesia was induced without difficulty. She was placed in the dorsal lithotomy position using the bumblebee stirrups. A ChloraPrep and Hibiclens abdominal perineal intravaginal prep and drape was performed in standard fashion. Timeout was completed. Red Robison catheter was used to drain 50 cc of urine from the bladder. Weighted speculum was placed in the vagina and a single-tooth tenaculum was placed on the anterior lip of the cervix. Uterus sounded to 8 cm; uterus was anteverted. Hanks dilators were used to dilate the endocervical canal to accommodate a 10 French suction curette. Following complete dilation the curettage was performed with production of  minimal tissue. Smooth and serrated curettes were then used to gently curettage the endometrium to verify that no significant residual tissue was left behind. Following completion of the procedure a Hulka tenaculum was placed onto the cervix to facilitate uterine manipulation. Laparoscopy was performed in standard fashion. Was made in the subumbilical region. Direct entry into the abdominal pelvic cavity was performed with the Optiview laparoscopic trocar system. No bowel or vascular injury was identified. Pneumoperitoneum was created with CO2 gas. An 11 mm port was placed in the right lower quadrant under direct visualization. A 5 mm port was placed in the suprapubic region 2 fingerbreadths above the symphysis pubis under direct visualization. The above-noted findings were documented. The pelvis was irrigated with the irrigant fluid being subsequently aspirated. The right partial salpingectomy was then performed with the Ace Harmonic scalpel with the aid of grasping forceps. The midsegment portion of the tube including the tubal mass was excised; excellent hemostasis was obtained. Irrigation of the operative site was completed. The irrigant fluid was aspirated. Final inspection of the pelvis revealed excellent hemostasis. The 11 mm port was closed using the suture applicator and 0 Vicryl with nice closure of the 11 mm port site. Dermabond glue was used to cover the 5 mm skin incision sites. A 4-0 Monocryl suture was used to close the 11 mm port site. On completion of the procedure patient was awakened mobilized and taken to recovery in satisfactory condition.  [name], MD, [facility]

## 2016-12-12 NOTE — Anesthesia Postprocedure Evaluation (Signed)
Anesthesia Post Note  Patient: Tammy Delacruz  Procedure(s) Performed: Procedure(s) (LRB): DILATATION AND EVACUATION (N/A) LAPAROSCOPY WITH EXCISION TUBAL PREGNANCY (N/A)  Patient location during evaluation: PACU Anesthesia Type: General Level of consciousness: awake and alert Pain management: pain level controlled Vital Signs Assessment: post-procedure vital signs reviewed and stable Respiratory status: spontaneous breathing, nonlabored ventilation, respiratory function stable and patient connected to nasal cannula oxygen Cardiovascular status: blood pressure returned to baseline and stable Postop Assessment: no signs of nausea or vomiting Anesthetic complications: no     Last Vitals:  Vitals:   12/09/16 1531 12/09/16 1541  BP: 116/86 107/67  Pulse: 82   Resp: 16 16  Temp:      Last Pain:  Vitals:   12/09/16 1531  TempSrc:   PainSc: 4                  Cleda MccreedyJoseph K Ajane Novella

## 2016-12-13 ENCOUNTER — Encounter: Payer: Self-pay | Admitting: Obstetrics and Gynecology

## 2016-12-13 LAB — SURGICAL PATHOLOGY

## 2016-12-15 ENCOUNTER — Encounter: Payer: Commercial Managed Care - PPO | Admitting: Obstetrics and Gynecology

## 2016-12-16 ENCOUNTER — Ambulatory Visit (INDEPENDENT_AMBULATORY_CARE_PROVIDER_SITE_OTHER): Payer: Commercial Managed Care - PPO | Admitting: Obstetrics and Gynecology

## 2016-12-16 ENCOUNTER — Other Ambulatory Visit: Payer: Self-pay | Admitting: Obstetrics and Gynecology

## 2016-12-16 ENCOUNTER — Encounter: Payer: Self-pay | Admitting: Obstetrics and Gynecology

## 2016-12-16 VITALS — BP 125/69 | HR 74 | Ht 62.0 in | Wt 150.6 lb

## 2016-12-16 DIAGNOSIS — Z09 Encounter for follow-up examination after completed treatment for conditions other than malignant neoplasm: Principal | ICD-10-CM

## 2016-12-16 DIAGNOSIS — Z9889 Other specified postprocedural states: Secondary | ICD-10-CM

## 2016-12-16 NOTE — Progress Notes (Signed)
HPI:      Ms. Isac CaddyCinthya P Pascual-Serrato is a 26 y.o. [name] who LMP was Patient's last menstrual period was 10/01/2016 (approximate)..  Subjective: She presents today after having a laparoscopy for ectopic pregnancy. She is doing well. She continues to have some mild to moderate abdominal pain but this is not limited her activities.     Hx: The following portions of the patient's history were reviewed and updated as appropriate:           She  has a past medical history of Complication of anesthesia and Medical history non-contributory. She  does not have any pertinent problems on file. She  has a past surgical history that includes Cholecystectomy; Dilation and evacuation (N/A, 12/09/2016); laparoscopy (N/A, 12/09/2016); Dilation and curettage of uterus; and Tubal ligation. She has a current medication list which includes the following prescription(s): ibuprofen and oxycodone-acetaminophen.        ROS: Constitutional: Denied constitutional symptoms, night sweats, recent illness, fatigue, fever, insomnia and weight loss.  Eyes: Denied eye symptoms, eye pain, photophobia, vision change and visual disturbance.  Ears/Nose/Throat/Neck: Denied ear, nose, throat or neck symptoms, hearing loss, nasal discharge, sinus congestion and sore throat.  Cardiovascular: Denied cardiovascular symptoms, arrhythmia, chest pain/pressure, edema, exercise intolerance, orthopnea and palpitations.  Respiratory: Denied pulmonary symptoms, asthma, pleuritic pain, productive sputum, cough, dyspnea and wheezing.  Gastrointestinal: Denied, gastro-esophageal reflux, melena, nausea and vomiting.  Genitourinary: Denied genitourinary symptoms including symptomatic vaginal discharge, pelvic relaxation issues, and urinary complaints.  Musculoskeletal: Denied musculoskeletal symptoms, stiffness, swelling, muscle weakness and myalgia.  Dermatologic: Denied dermatology symptoms, rash and scar.  Neurologic: Denied neurology  symptoms, dizziness, headache, neck pain and syncope.  Psychiatric: Denied psychiatric symptoms, anxiety and depression.  Endocrine: Denied endocrine symptoms including hot flashes and night sweats.     Objective: There were no vitals filed for this visit.            Abdomen: Soft.  Non-tender.  No masses.  No HSM.  Incision/s: Intact.  Healing well.  No erythema.  No drainage.      Assessment: Postop check  S/P laparoscopy  for ectopic   Plan:              Orders No orders of the defined types were placed in this encounter.   1.  We will follow quantitative beta hCG until 0.  2.  I have discussed the pathology with the patient. We have discussed ectopic pregnancy.        F/U  Return in about 4 weeks (around 01/13/2017).  Elonda Huskyavid J. Evans, M.D. 12/16/2016 4:11 PM

## 2016-12-16 NOTE — Progress Notes (Signed)
Pt is here for a post op check after D&C for miscarriage and T.L done on 12/09/16. C/o slight soreness. Denies discharge or bleeding.

## 2016-12-17 LAB — BETA HCG QUANT (REF LAB): hCG Quant: <1 m[IU]/mL

## 2017-01-12 ENCOUNTER — Encounter: Payer: Commercial Managed Care - PPO | Admitting: Obstetrics and Gynecology

## 2017-01-12 ENCOUNTER — Encounter: Payer: Self-pay | Admitting: Obstetrics and Gynecology

## 2017-01-12 ENCOUNTER — Ambulatory Visit (INDEPENDENT_AMBULATORY_CARE_PROVIDER_SITE_OTHER): Payer: Commercial Managed Care - PPO | Admitting: Obstetrics and Gynecology

## 2017-01-12 VITALS — BP 97/61 | HR 76 | Ht 61.0 in | Wt 157.9 lb

## 2017-01-12 DIAGNOSIS — Z9889 Other specified postprocedural states: Secondary | ICD-10-CM

## 2017-01-12 DIAGNOSIS — Z09 Encounter for follow-up examination after completed treatment for conditions other than malignant neoplasm: Principal | ICD-10-CM

## 2017-01-12 NOTE — Patient Instructions (Signed)
1. Resume all activities without restriction 2. Contraception-condoms 3. Recommend multivitamin daily

## 2017-01-12 NOTE — Progress Notes (Signed)
Chief complaint: 1. Status post laparoscopic right salpingectomy for unruptured tubal pregnancy; status post suction D&C 2. Final postop check  Patient presents for final postop check after surgery on 12/09/2016. Patient had unruptured right tubal pregnancy. At this time she is having normal bowel and bladder function. She is not experiencing any pelvic pain. She has not had onset of menses since her surgery.  PATHOLOGY: DIAGNOSIS:  A. PRODUCTS OF CONCEPTION; SUCTION DILATATION AND CURETTAGE:  - PROLIFERATIVE ENDOMETRIUM.  - FRAGMENT OF BENIGN ENDOCERVICAL TISSUE.   B. RIGHT FALLOPIAN TUBE, PARTIAL RIGHT SALPINGECTOMY:  - FALLOPIAN TUBE WITH LUMINAL IMMATURE DECIDUA AND CHORIONIC VILLI,  CONSISTENT WITH ECTOPIC GESTATION.    Quantitative hCG on 12/16/2016 was less than 1  OBJECTIVE: BP 97/61   Pulse 76   Ht 5\' 1"  (1.549 m)   Wt 157 lb 14.4 oz (71.6 kg)   LMP 10/01/2016 (Approximate)   BMI 29.83 kg/m  Pleasant female in no acute distress Abdomen: Soft, nontender without organomegaly; laparoscopy incision sites are well approximated without evidence of infection or hernia. Small residual suture as noted in the lateral aspect of right lower quadrant port site. Pelvic exam deferred  ASSESSMENT: 1. Final postop check-normal 2. Status post laparoscopic right partial salpingectomy for unruptured ectopic pregnancy and suction D&C  PLAN: 1. Resume all activities without restriction 2. Contraception-condoms 3. Recommend multivitamin or prenatal vitamin daily 4. Follow-up as needed for any gynecologic issues or if pregnancy occurs.  Herold HarmsMartin A Del Wiseman, MD  Note: This dictation was prepared with Dragon dictation along with smaller phrase technology. Any transcriptional errors that result from this process are unintentional.

## 2017-06-01 IMAGING — US US OB TRANSVAGINAL
1 series · 14 of 28 positions shown · non-contrast
Comparison: None.

CLINICAL DATA: Pelvic pain and vaginal bleeding. Early pregnancy.
Quantitative beta HCG 58. Gestational age by last menstrual period 7
weeks 3 days.

EXAM:
OBSTETRIC <14 WK US AND TRANSVAGINAL OB US
TECHNIQUE: Both transabdominal and transvaginal ultrasound examinations were
performed for complete evaluation of the gestation as well as the
maternal uterus, adnexal regions, and pelvic cul-de-sac.
Transvaginal technique was performed to assess early pregnancy.

[Series 1: us ob transvaginal · 0.20mm/px · 14 of 93 slices shown]
[im 4/93]
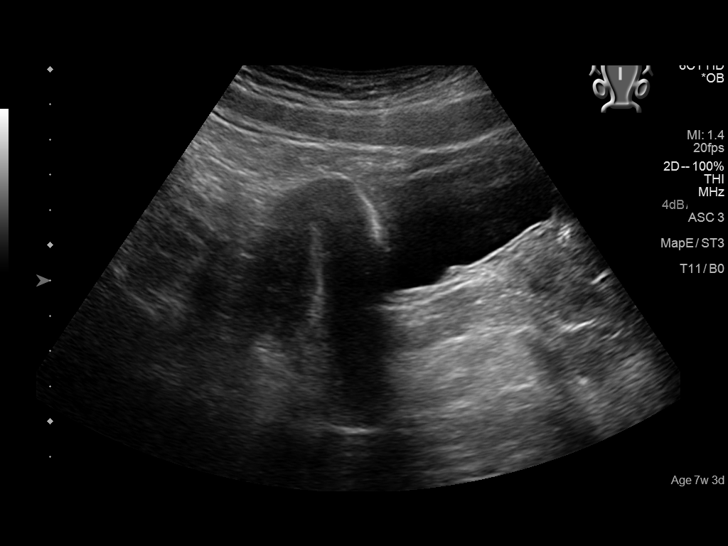
[im 11/93]
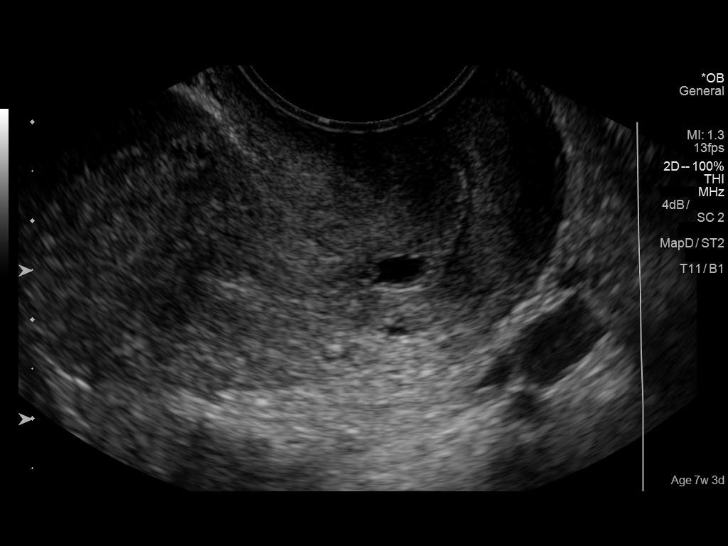
[im 18/93]
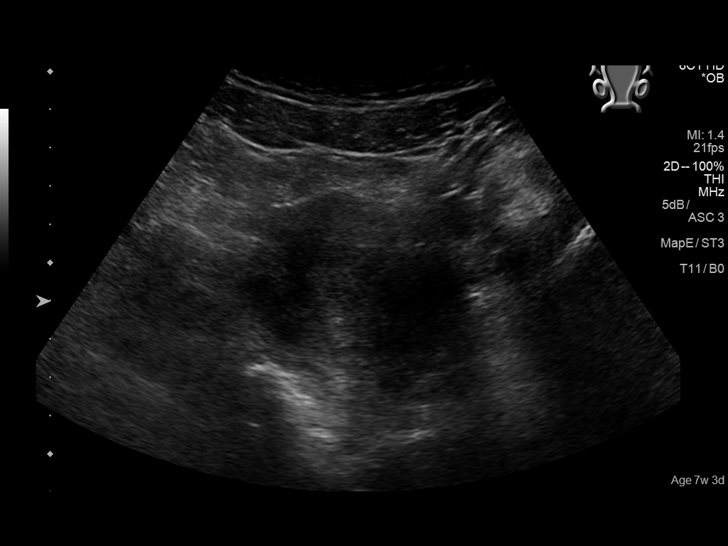
[im 24/93]
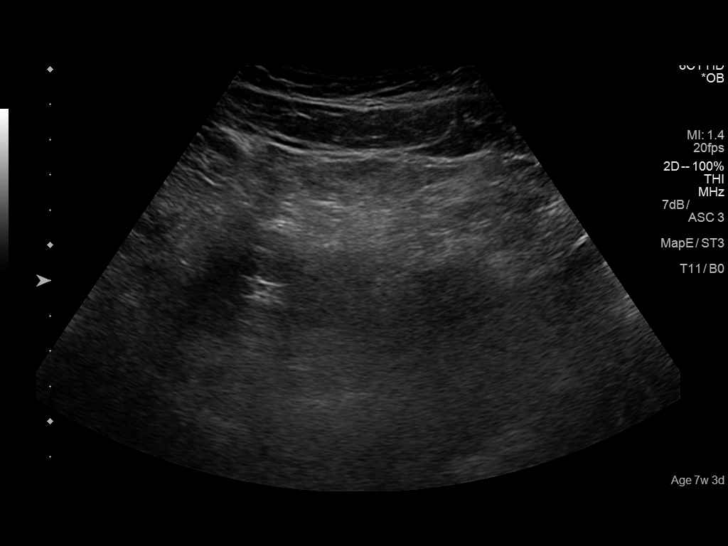
[im 31/93]
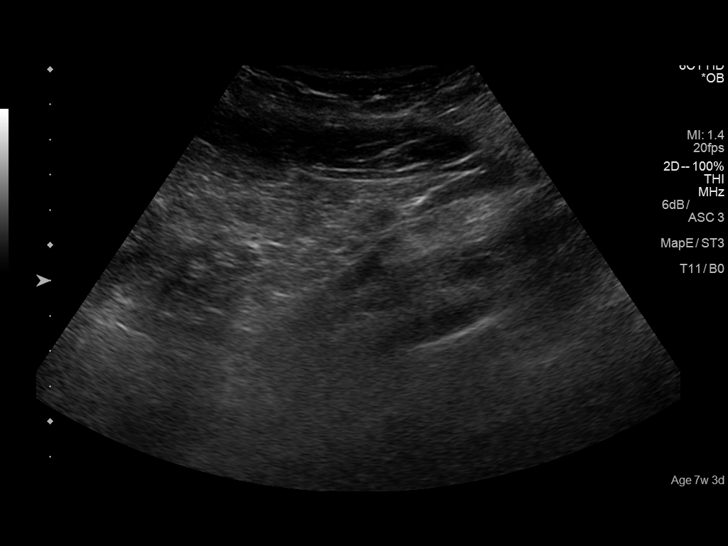
[im 38/93]
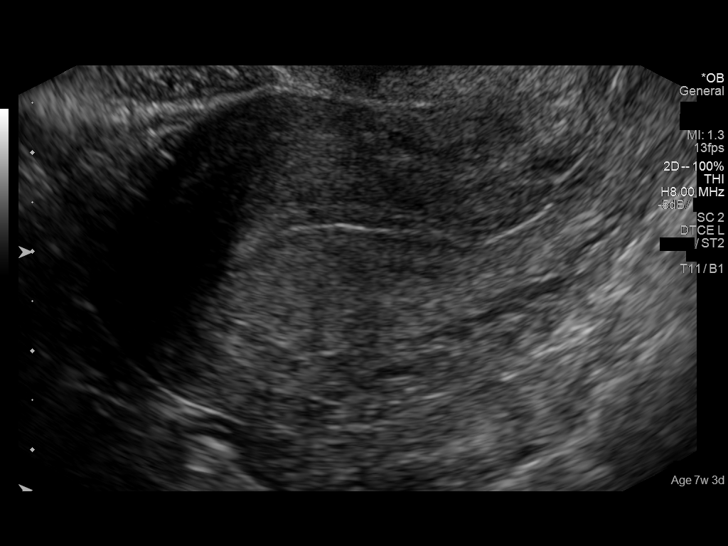
[im 45/93]
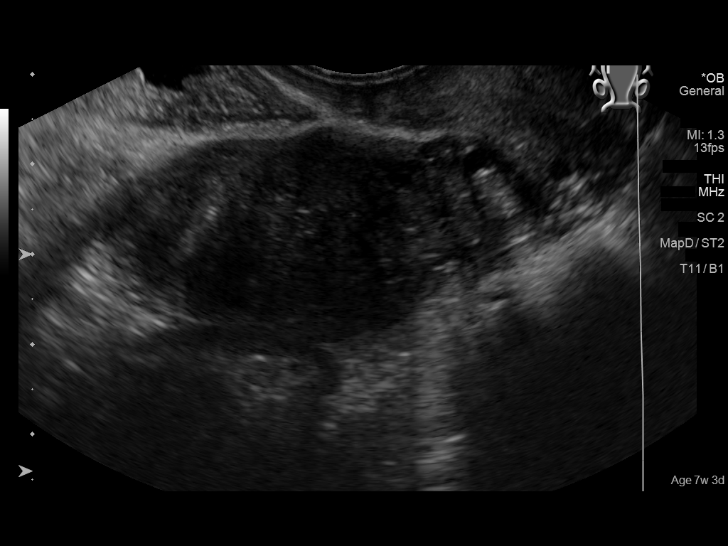
[im 52/93]
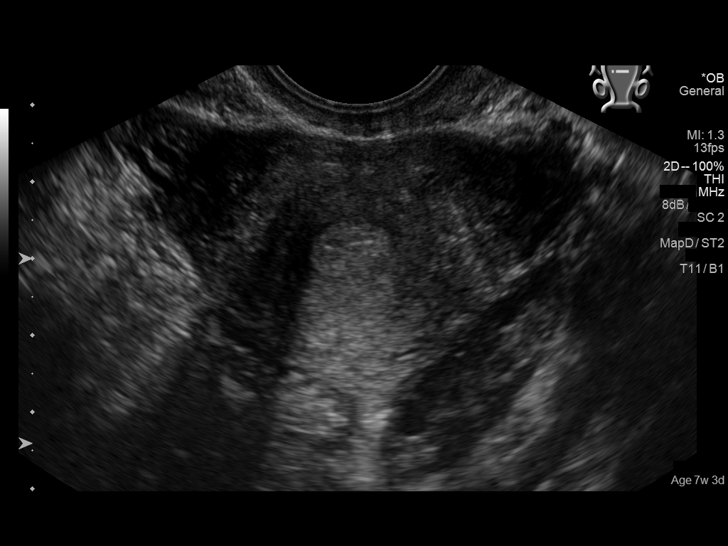
[im 58/93]
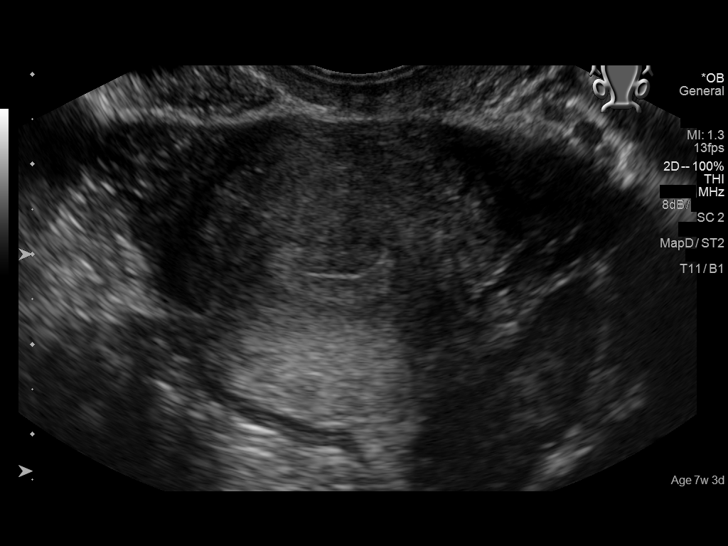
[im 65/93]
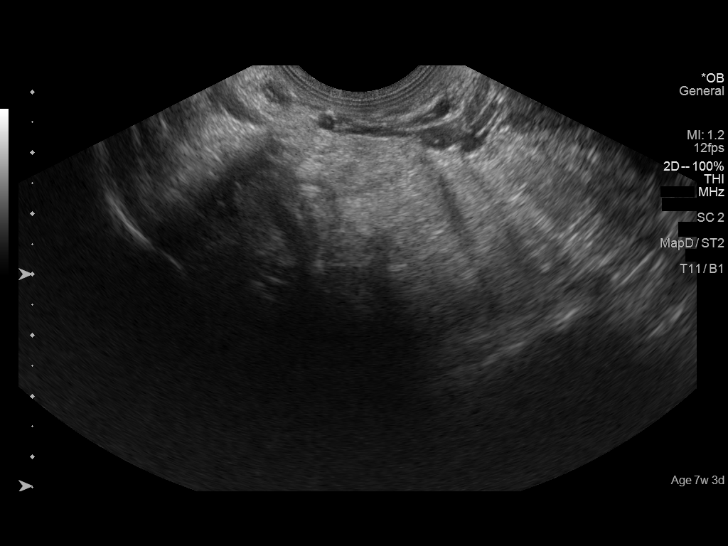
[im 72/93]
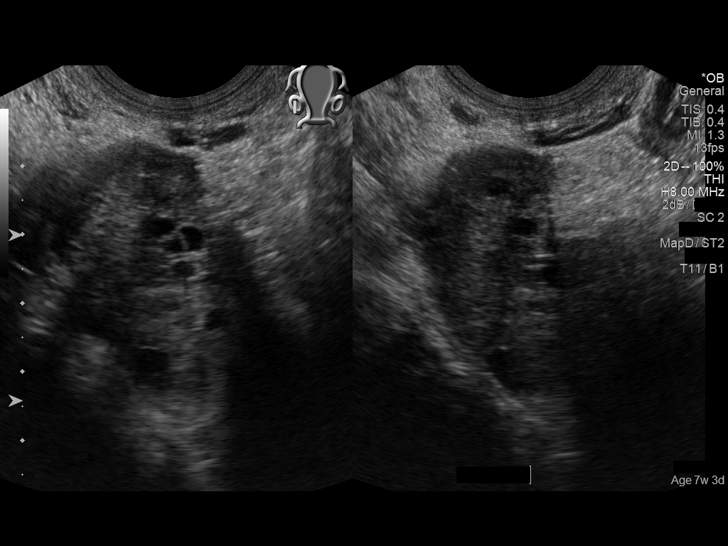
[im 79/93]
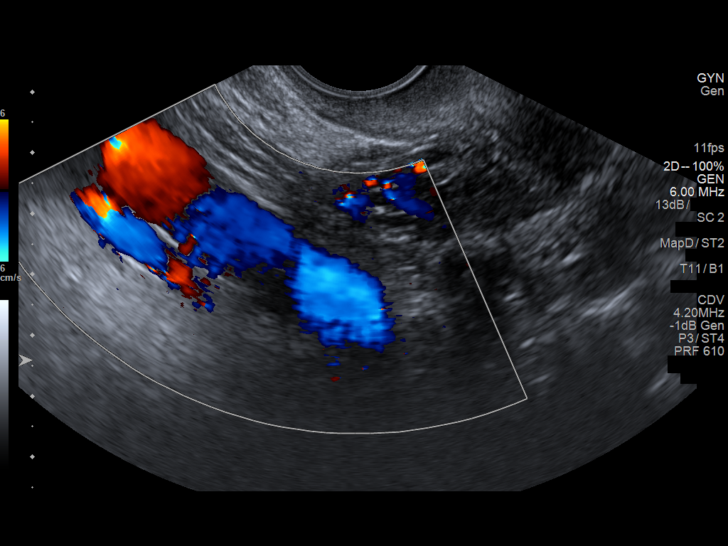
[im 86/93]
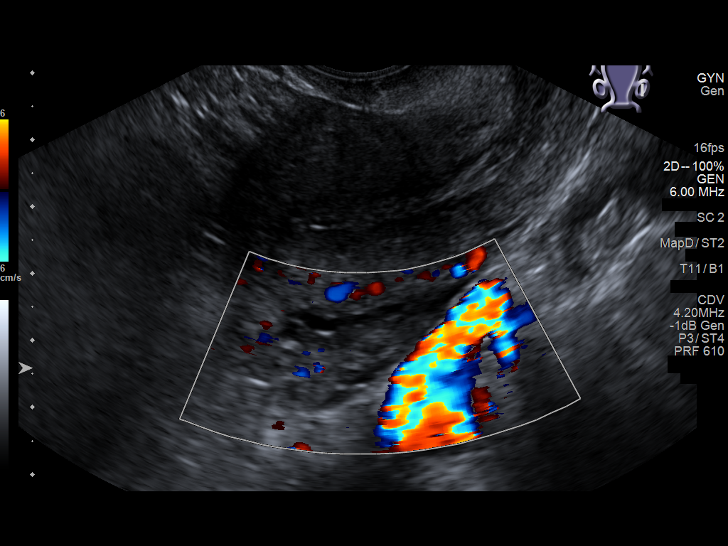
[im 93/93]
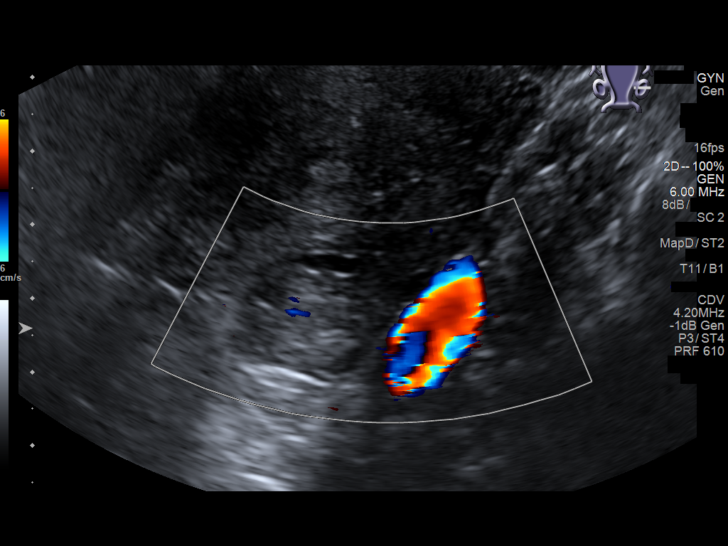

[14 of 28 positions shown; findings below may reference images not displayed]

FINDINGS: Intrauterine gestational sac: Absent

Yolk sac:  Absent

Embryo:  Absent

Cardiac Activity:

Subchorionic hemorrhage:  None visualized.

Maternal uterus/adnexae: Endometrial thickness 8 mm. Uterine length
8.3 cm. Small nabothian cyst noted.

Right ovary 3.8 by 2.3 by 1.7 cm. Left ovary 3.0 by 1.6 by 2.3 cm.
Both ovaries appear normal and have expected color flow internally.
No adnexal mass or free pelvic fluid.
IMPRESSION: 1. No visible gestational sac or adnexal mass. No free pelvic fluid.
Differential diagnostic considerations include very early pregnancy
or prior spontaneous abortion. Correlate with quantitative beta HCG
trend and follow up.

## 2018-10-03 ENCOUNTER — Ambulatory Visit
Admission: EM | Admit: 2018-10-03 | Discharge: 2018-10-03 | Disposition: A | Discharge status: Home / Self Care | Payer: Commercial Managed Care - PPO | Attending: Family Medicine | Admitting: Family Medicine

## 2018-10-03 ENCOUNTER — Ambulatory Visit (INDEPENDENT_AMBULATORY_CARE_PROVIDER_SITE_OTHER)
Admit: 2018-10-03 | Discharge: 2018-10-03 | Disposition: A | Discharge status: Home / Self Care | Payer: Commercial Managed Care - PPO | Attending: Family Medicine | Admitting: Family Medicine

## 2018-10-03 ENCOUNTER — Other Ambulatory Visit: Payer: Self-pay

## 2018-10-03 DIAGNOSIS — N938 Other specified abnormal uterine and vaginal bleeding: Secondary | ICD-10-CM | POA: Diagnosis not present

## 2018-10-03 DIAGNOSIS — N939 Abnormal uterine and vaginal bleeding, unspecified: Secondary | ICD-10-CM

## 2018-10-03 LAB — CBC WITH DIFFERENTIAL/PLATELET
ABS IMMATURE GRANULOCYTES: 0.05 10*3/uL (ref 0.00–0.07)
Basophils Absolute: 0 10*3/uL (ref 0.0–0.1)
Basophils Relative: 1 %
Eosinophils Absolute: 0.1 10*3/uL (ref 0.0–0.5)
Eosinophils Relative: 1 %
HCT: 41.2 % (ref 36.0–46.0)
HEMOGLOBIN: 13.6 g/dL (ref 12.0–15.0)
Immature Granulocytes: 1 %
LYMPHS ABS: 2.7 10*3/uL (ref 0.7–4.0)
LYMPHS PCT: 31 %
MCH: 30 pg (ref 26.0–34.0)
MCHC: 33 g/dL (ref 30.0–36.0)
MCV: 90.9 fL (ref 80.0–100.0)
MONO ABS: 0.5 10*3/uL (ref 0.1–1.0)
MONOS PCT: 5 %
NEUTROS ABS: 5.5 10*3/uL (ref 1.7–7.7)
Neutrophils Relative %: 61 %
Platelets: 337 10*3/uL (ref 150–400)
RBC: 4.53 MIL/uL (ref 3.87–5.11)
RDW: 12.9 % (ref 11.5–15.5)
WBC: 8.8 10*3/uL (ref 4.0–10.5)
nRBC: 0 % (ref 0.0–0.2)

## 2018-10-03 LAB — PREGNANCY, URINE: Preg Test, Ur: NEGATIVE

## 2018-10-03 LAB — HCG, QUANTITATIVE, PREGNANCY

## 2018-10-03 MED ORDER — MEDROXYPROGESTERONE ACETATE 5 MG PO TABS: 5.0000 mg | ORAL_TABLET | Freq: Every day | ORAL | 0 refills | Status: DC

## 2018-10-03 NOTE — Discharge Instructions (Addendum)
Take medication as prescribed. Rest. Drink plenty of fluids.   Follow-up with gynecology.  See above to call to schedule.  Follow up with your primary care physician this week as needed. Return to Urgent care for new or worsening concerns.

## 2018-10-03 NOTE — ED Triage Notes (Signed)
Patient complains of vaginal bleeding x 12 days. States that she doesn't remember her last menstrual cycle since she is irregular. Patient states that flow was light for 10 days and has got heavier over last 2 days.

## 2018-10-03 NOTE — ED Provider Notes (Addendum)
[facility] ____________________________________________  Time seen: Approximately 9:25 AM  I have reviewed the triage vital signs and the nursing notes.   HISTORY  Chief Complaint Vaginal Bleeding   HPI Tammy Delacruz is a 27 y.o. female presented for evaluation of abnormal vaginal bleeding.  Patient reports she has been having vaginal bleeding for the last 12 days, stating initially it was more of spotting and lighter bleeding in the last 2 days she has had increase of amount of bleeding with darker blood.  Patient reports that she has had irregular menstrual cycles since she had an ectopic pregnancy at the beginning of 2018.  States that she believes they removed her right tube during that surgery.  States that since then she has had  irregular menstrual cycles and generally very light cycles.  Unsure of her last menstrual cycle.  Does report she is sexually active, not always with condoms.  Unsure of potential pregnancy.  Patient expressed concern of lasting vaginal bleeding with similar presentation of previous ectopic pregnancy.  Patient does report intermittent cramping pelvic pain, none currently.  Denies any other abnormal vaginal discharge, rash, lesions, sores.  Denies abdominal pain, back pain, dysuria, fevers, chest pain, shortness of breath, weakness, syncope or near syncope.  Continues eating drink well.  No trauma.  Has not been evaluated for the same complaints.  Unsure of how many pads and tampons she is going through at this time, but states bleeding is much more than her typical.  Reports otherwise doing well.  Not a smoker.  Denies hypertension or cardiac history.     past medical history. Ectopic pregnancy.  There are no active problems to display for this patient.   Past Surgical History:  Procedure Laterality Date  . CHOLECYSTECTOMY       No current facility-administered medications for this encounter.   Current Outpatient Medications:  .   medroxyPROGESTERone (PROVERA) 5 MG tablet, Take 1 tablet (5 mg total) by mouth daily., Disp: 7 tablet, Rfl: 0  Allergies Patient has no known allergies.  Family History  Problem Relation Age of Onset  . Healthy Mother   . Healthy Father     Social History Social History   Tobacco Use  . Smoking status: Never Smoker  . Smokeless tobacco: Never Used  Substance Use Topics  . Alcohol use: Yes    Comment: occasionally  . Drug use: No    Review of Systems Constitutional: No fever Cardiovascular: Denies chest pain. Respiratory: Denies shortness of breath. Gastrointestinal: No abdominal pain.  No nausea, no vomiting.  No diarrhea.  No constipation. As above.  Genitourinary: Negative for dysuria. Musculoskeletal: Negative for back pain. Skin: Negative for rash.   ____________________________________________   PHYSICAL EXAM:  VITAL SIGNS: ED Triage Vitals  Enc Vitals Group     BP 10/03/18 0918 115/88     Pulse Rate 10/03/18 0918 76     Resp 10/03/18 0918 16     Temp 10/03/18 0918 98.4 F (36.9 C)     Temp Source 10/03/18 0918 Oral     SpO2 10/03/18 0918 98 %     Weight 10/03/18 0914 185 lb (83.9 kg)     Height 10/03/18 0914 5\' 1"  (1.549 m)     Head Circumference --      Peak Flow --      Pain Score 10/03/18 0914 0     Pain Loc --      Pain Edu? --      Excl. in  GC? --     Constitutional: Alert and oriented. Well appearing and in no acute distress. ENT      Head: Normocephalic and atraumatic. Cardiovascular: Normal rate, regular rhythm. Grossly normal heart sounds.  Good peripheral circulation. Respiratory: Normal respiratory effort without tachypnea nor retractions. Breath sounds are clear and equal bilaterally. No wheezes, rales, rhonchi. Gastrointestinal: Normal Bowel sounds. No CVA tenderness.  Mild midline and bilateral suprapubic tenderness to palpation, abdomen otherwise soft and nontender.  Non-guarding. Musculoskeletal: No midline cervical, thoracic or  lumbar tenderness to palpation.  Neurologic:  Normal speech and language. Speech is normal. No gait instability.  Skin:  Skin is warm, dry and intact. No rash noted. Psychiatric: Mood and affect are normal. Speech and behavior are normal. Patient exhibits appropriate insight and judgment   ___________________________________________   LABS (all labs ordered are listed, but only abnormal results are displayed)  Labs Reviewed  PREGNANCY, URINE  CBC WITH DIFFERENTIAL/PLATELET  HCG, QUANTITATIVE, PREGNANCY    RADIOLOGY  US Pelvis Transvanginal Non-ob (tv Only)  Result Date: 10/03/2018 CLINICAL DATA:  Dysfunctional uterine bleeding. Pain. Prior history of surgery for ectopic pregnancy. EXAM: TRANSABDOMINAL AND TRANSVAGINAL ULTRASOUND OF PELVIS DOPPLER ULTRASOUND OF OVARIES TECHNIQUE: Both transabdominal and transvaginal ultrasound examinations of the pelvis were performed. Transabdominal technique was performed for global imaging of the pelvis including uterus, ovaries, adnexal regions, and pelvic cul-de-sac. It was necessary to proceed with endovaginal exam following the transabdominal exam to visualize the uterus and ovaries. Color and duplex Doppler ultrasound was utilized to evaluate blood flow to the ovaries. COMPARISON:  No prior. FINDINGS: Uterus Measurements: 7.8 x 3.5 x 4.6 cm = volume: 65.6 mL. No fibroids or other mass visualized. Endometrium Thickness: 8 mm.  No focal abnormality visualized. Right ovary Measurements: 3.4 x 1.8 x 1.8 cm = volume: 5.8 mL. Normal appearance/no adnexal Left ovary Measurements: 3.2 x 2.0 x 2.6 cm = volume: 8.7 mL. Normal appearance/no adnexal mass. Pulsed Doppler evaluation of both ovaries demonstrates normal low-resistance arterial and venous waveforms. Other findings No abnormal free fluid. IMPRESSION: Normal exam. Electronically Signed   By: Maisie Fus  Register   On: 10/03/2018 12:09   US Pelvis Complete  Result Date: 10/03/2018 CLINICAL DATA:   Dysfunctional uterine bleeding. Pain. Prior history of surgery for ectopic pregnancy. EXAM: TRANSABDOMINAL AND TRANSVAGINAL ULTRASOUND OF PELVIS DOPPLER ULTRASOUND OF OVARIES TECHNIQUE: Both transabdominal and transvaginal ultrasound examinations of the pelvis were performed. Transabdominal technique was performed for global imaging of the pelvis including uterus, ovaries, adnexal regions, and pelvic cul-de-sac. It was necessary to proceed with endovaginal exam following the transabdominal exam to visualize the uterus and ovaries. Color and duplex Doppler ultrasound was utilized to evaluate blood flow to the ovaries. COMPARISON:  No prior. FINDINGS: Uterus Measurements: 7.8 x 3.5 x 4.6 cm = volume: 65.6 mL. No fibroids or other mass visualized. Endometrium Thickness: 8 mm.  No focal abnormality visualized. Right ovary Measurements: 3.4 x 1.8 x 1.8 cm = volume: 5.8 mL. Normal appearance/no adnexal Left ovary Measurements: 3.2 x 2.0 x 2.6 cm = volume: 8.7 mL. Normal appearance/no adnexal mass. Pulsed Doppler evaluation of both ovaries demonstrates normal low-resistance arterial and venous waveforms. Other findings No abnormal free fluid. IMPRESSION: Normal exam. Electronically Signed   By: Maisie Fus  Register   On: 10/03/2018 12:09   US Pelvic Doppler (torsion R/o Or Mass Arterial Flow)  Result Date: 10/03/2018 CLINICAL DATA:  Dysfunctional uterine bleeding. Pain. Prior history of surgery for ectopic pregnancy. EXAM: TRANSABDOMINAL AND TRANSVAGINAL ULTRASOUND OF  PELVIS DOPPLER ULTRASOUND OF OVARIES TECHNIQUE: Both transabdominal and transvaginal ultrasound examinations of the pelvis were performed. Transabdominal technique was performed for global imaging of the pelvis including uterus, ovaries, adnexal regions, and pelvic cul-de-sac. It was necessary to proceed with endovaginal exam following the transabdominal exam to visualize the uterus and ovaries. Color and duplex Doppler ultrasound was utilized to evaluate  blood flow to the ovaries. COMPARISON:  No prior. FINDINGS: Uterus Measurements: 7.8 x 3.5 x 4.6 cm = volume: 65.6 mL. No fibroids or other mass visualized. Endometrium Thickness: 8 mm.  No focal abnormality visualized. Right ovary Measurements: 3.4 x 1.8 x 1.8 cm = volume: 5.8 mL. Normal appearance/no adnexal Left ovary Measurements: 3.2 x 2.0 x 2.6 cm = volume: 8.7 mL. Normal appearance/no adnexal mass. Pulsed Doppler evaluation of both ovaries demonstrates normal low-resistance arterial and venous waveforms. Other findings No abnormal free fluid. IMPRESSION: Normal exam. Electronically Signed   By: Maisie Fus  Register   On: 10/03/2018 12:09   ____________________________________________   PROCEDURES Procedures   INITIAL IMPRESSION / ASSESSMENT AND PLAN / ED COURSE  Pertinent labs & imaging results that were available during my care of the patient were reviewed by me and considered in my medical decision making (see chart for details).  Well-appearing patient.  No acute distress.  Presenting for evaluation of dysfunctional uterine bleeding that is continued for the last 12 days.  Urine pregnancy negative, will also evaluate CBC and serum hCG.  Serum hCG negative and CBC unremarkable.  As patient has had continued bleeding as well as intermittent pain, pelvic ultrasound also evaluated, results as above per radiologist.  Pelvic ultrasound normal.  Dysfunction uterine bleeding, and recommend follow-up with OB/GYN in one week.  Will treat with Provera 5 mg daily for 7 days.  Discussed supportive care and strict follow-up and return parameters.Discussed indication, risks and benefits of medications with patient.   Discussed follow up and return parameters including no resolution or any worsening concerns. Patient verbalized understanding and agreed to plan.   ____________________________________________   FINAL CLINICAL IMPRESSION(S) / ED DIAGNOSES  Final diagnoses:  Vaginal bleeding  Dysfunctional  uterine bleeding     ED Discharge Orders         Ordered    medroxyPROGESTERone (PROVERA) 5 MG tablet  Daily     10/03/18 1218           Note: This dictation was prepared with Dragon dictation along with smaller phrase technology. Any transcriptional errors that result from this process are unintentional.         Renford Dills, NP 10/03/18 1326    Renford Dills, NP 10/03/18 1328

## 2019-05-29 ENCOUNTER — Telehealth: Payer: Self-pay | Admitting: *Deleted

## 2019-05-29 ENCOUNTER — Other Ambulatory Visit: Payer: Commercial Managed Care - PPO

## 2019-05-29 DIAGNOSIS — Z20822 Contact with and (suspected) exposure to covid-19: Secondary | ICD-10-CM

## 2019-05-29 NOTE — Telephone Encounter (Signed)
Pt referred by [facility] for covid-19 testing. Pt is scheduled for today at 3 pm at the [facility] in [place]. Pt given the address. Advised that this is a drive thru test site, stay in car with mask on and windows rolled up until time for testing. Pt voiced understanding.

## 2019-06-05 LAB — NOVEL CORONAVIRUS, NAA: SARS-CoV-2, NAA: NOT DETECTED

## 2019-07-23 ENCOUNTER — Emergency Department
Admission: EM | Admit: 2019-07-23 | Discharge: 2019-07-23 | Disposition: A | Discharge status: Home / Self Care | Payer: BC Managed Care – PPO | Attending: Emergency Medicine | Admitting: Emergency Medicine

## 2019-07-23 ENCOUNTER — Other Ambulatory Visit: Payer: Self-pay

## 2019-07-23 ENCOUNTER — Emergency Department: Payer: BC Managed Care – PPO

## 2019-07-23 DIAGNOSIS — U071 COVID-19: Secondary | ICD-10-CM | POA: Insufficient documentation

## 2019-07-23 DIAGNOSIS — J189 Pneumonia, unspecified organism: Secondary | ICD-10-CM

## 2019-07-23 DIAGNOSIS — J1289 Other viral pneumonia: Secondary | ICD-10-CM | POA: Insufficient documentation

## 2019-07-23 DIAGNOSIS — R0602 Shortness of breath: Secondary | ICD-10-CM | POA: Diagnosis present

## 2019-07-23 LAB — CBC WITH DIFFERENTIAL/PLATELET
Abs Immature Granulocytes: 0.03 10*3/uL (ref 0.00–0.07)
Basophils Absolute: 0 10*3/uL (ref 0.0–0.1)
Basophils Relative: 0 %
Eosinophils Absolute: 0 10*3/uL (ref 0.0–0.5)
Eosinophils Relative: 0 %
HCT: 38.7 % (ref 36.0–46.0)
Hemoglobin: 13.2 g/dL (ref 12.0–15.0)
Immature Granulocytes: 0 %
Lymphocytes Relative: 24 %
Lymphs Abs: 1.9 10*3/uL (ref 0.7–4.0)
MCH: 30 pg (ref 26.0–34.0)
MCHC: 34.1 g/dL (ref 30.0–36.0)
MCV: 88 fL (ref 80.0–100.0)
Monocytes Absolute: 0.5 10*3/uL (ref 0.1–1.0)
Monocytes Relative: 7 %
Neutro Abs: 5.2 10*3/uL (ref 1.7–7.7)
Neutrophils Relative %: 69 %
Platelets: 242 10*3/uL (ref 150–400)
RBC: 4.4 MIL/uL (ref 3.87–5.11)
RDW: 12.1 % (ref 11.5–15.5)
WBC: 7.7 10*3/uL (ref 4.0–10.5)
nRBC: 0 % (ref 0.0–0.2)

## 2019-07-23 LAB — COMPREHENSIVE METABOLIC PANEL
ALT: 42 U/L (ref 0–44)
AST: 29 U/L (ref 15–41)
Albumin: 3.8 g/dL (ref 3.5–5.0)
Alkaline Phosphatase: 45 U/L (ref 38–126)
Anion gap: 11 (ref 5–15)
BUN: 10 mg/dL (ref 6–20)
CO2: 22 mmol/L (ref 22–32)
Calcium: 8.8 mg/dL — ABNORMAL LOW (ref 8.9–10.3)
Chloride: 100 mmol/L (ref 98–111)
Creatinine, Ser: 0.83 mg/dL (ref 0.44–1.00)
GFR calc Af Amer: >60 mL/min (ref >60)
GFR calc non Af Amer: >60 mL/min (ref >60)
Glucose, Bld: 112 mg/dL — ABNORMAL HIGH (ref 70–99)
Potassium: 3.7 mmol/L (ref 3.5–5.1)
Sodium: 133 mmol/L — ABNORMAL LOW (ref 135–145)
Total Bilirubin: 1.1 mg/dL (ref 0.3–1.2)
Total Protein: 8.2 g/dL — ABNORMAL HIGH (ref 6.5–8.1)

## 2019-07-23 LAB — POCT PREGNANCY, URINE: Preg Test, Ur: NEGATIVE

## 2019-07-23 MED ORDER — AZITHROMYCIN 250 MG PO TABS: ORAL_TABLET | ORAL | 0 refills | Status: DC

## 2019-07-23 MED ORDER — ALBUTEROL SULFATE HFA 108 (90 BASE) MCG/ACT IN AERS
2.0000 | INHALATION_SPRAY | Freq: Once | RESPIRATORY_TRACT | Status: AC
  Administered 2019-07-23: 18:00:00 2 via RESPIRATORY_TRACT
  Filled 2019-07-23: qty 6.7

## 2019-07-23 MED ORDER — DEXAMETHASONE 4 MG PO TABS
10.0000 mg | ORAL_TABLET | Freq: Once | ORAL | Status: AC
  Administered 2019-07-23: 10 mg via ORAL
  Filled 2019-07-23: qty 2.5

## 2019-07-23 MED ORDER — IBUPROFEN 600 MG PO TABS
600.0000 mg | ORAL_TABLET | Freq: Once | ORAL | Status: AC
  Administered 2019-07-23: 600 mg via ORAL
  Filled 2019-07-23: qty 1

## 2019-07-23 MED ORDER — BENZONATATE 100 MG PO CAPS: 100.0000 mg | ORAL_CAPSULE | Freq: Three times a day (TID) | ORAL | 0 refills | Status: AC | PRN

## 2019-07-23 MED ORDER — AMOXICILLIN-POT CLAVULANATE 875-125 MG PO TABS: 1.0000 | ORAL_TABLET | Freq: Two times a day (BID) | ORAL | 0 refills | Status: AC

## 2019-07-23 NOTE — ED Triage Notes (Signed)
Pt sent from [facility] with c/o fever with SOB and chest tightness that is in between the shoulders in her back since Wednesday. Today had a temp 103.2 at [facility] and was given 1000mg  of tylenol at 1405. Temp on arrival 102.2. pt is in NAD on arrival denies any known exposure to covid.

## 2019-07-23 NOTE — Discharge Instructions (Addendum)
Use the inhaler every 4 hours as needed for wheezing   Take the antibiotics as prescribed  Quarantine at home until the results of your COVID test are known. After that, follow the included guidelines (must be at least 7 days from onset of symptoms, and fever free x 48 hours to return to work/outside).

## 2019-07-23 NOTE — ED Provider Notes (Signed)
[facility] Emergency Department Provider Note  ____________________________________________   First MD Initiated Contact with Patient 07/23/19 1637     (approximate)  I have reviewed the triage vital signs and the nursing notes.   HISTORY  Chief Complaint Shortness of Breath and Chest Pain    HPI Tammy Delacruz is a 28 y.o. female  With no significant PMHx here with cough, SOB. Pt reports that her sx started 2-3 days ago as mild cough and SOB. Since then she's had fatigue, dyspnea wth exertion, and occasional SOB at rest. No h/o same. 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 No other complaints. Mild loss of appetite noted. No abd pain, n/v/d. SOB is w/ exertion only. No LE edema or swelling. No recent travel. No OCP or other hormone use, no leg swelling, no recent immobilization. Has not taken anything today and did not know she had a fever until check in triage.        Past Medical History:  Diagnosis Date  . Complication of anesthesia    nausea  . Medical history non-contributory     Patient Active Problem List   Diagnosis Date Noted  . S/P D&C (status post dilation and curettage) 12/09/2016  . S/P laparoscopy 12/09/2016  . Abnormal pregnancy in first trimester 12/06/2016  . Threatened abortion 12/01/2016    Past Surgical History:  Procedure Laterality Date  . CHOLECYSTECTOMY    . DILATION AND CURETTAGE OF UTERUS    . DILATION AND EVACUATION N/A 12/09/2016   Procedure: DILATATION AND EVACUATION;  Surgeon: Herold Harms, MD;  Location: [facility] ORS;  Service: Gynecology;  Laterality: N/A;  . LAPAROSCOPY N/A 12/09/2016   Procedure: LAPAROSCOPY WITH EXCISION TUBAL PREGNANCY;  Surgeon: Herold Harms, MD;  Location: [facility] ORS;  Service: Gynecology;  Laterality: N/A;  . UNILATERAL SALPINGECTOMY Right    partial-    Prior to Admission medications   Medication Sig Start Date End Date Taking?  Authorizing Provider  amoxicillin-clavulanate (AUGMENTIN) 875-125 MG tablet Take 1 tablet by mouth 2 (two) times daily for 7 days. 07/23/19 07/30/19  Shaune Pollack, MD  azithromycin (ZITHROMAX Z-PAK) 250 MG tablet Take 2 tablets (500 mg) on  Day 1,  followed by 1 tablet (250 mg) once daily on Days 2 through 5. 07/23/19   Shaune Pollack, MD  benzonatate (TESSALON PERLES) 100 MG capsule Take 1 capsule (100 mg total) by mouth 3 (three) times daily as needed for cough. 07/23/19 07/22/20  Shaune Pollack, MD    Allergies Patient has no known allergies.  Family History  Problem Relation Age of Onset  . Diabetes Maternal Grandmother   . Breast cancer Neg Hx   . Ovarian cancer Neg Hx   . Colon cancer Neg Hx   . Heart disease Neg Hx     Social History Social History   Tobacco Use  . Smoking status: Never Smoker  . Smokeless tobacco: Never Used  Substance Use Topics  . Alcohol use: Yes    Comment: occas  . Drug use: No    Review of Systems  Review of Systems  Constitutional: Positive for chills and fatigue. Negative for fever.  HENT: Positive for sore throat. Negative for congestion.   Eyes: Negative for visual disturbance.  Respiratory: Positive for cough and shortness of breath.   Cardiovascular: Negative for chest pain.  Gastrointestinal: Negative for abdominal pain, diarrhea, nausea and vomiting.  Genitourinary: Negative for flank pain.  Musculoskeletal: Negative  for back pain and neck pain.  Skin: Negative for rash and wound.  Neurological: Negative for weakness.  All other systems reviewed and are negative.    ____________________________________________  PHYSICAL EXAM:      VITAL SIGNS: ED Triage Vitals [07/23/19 1435]  Enc Vitals Group     BP 121/72     Pulse Rate (!) 111     Resp 17     Temp (!) 102.2 F (39 C)     Temp Source Oral     SpO2 96 %     Weight 180 lb (81.6 kg)     Height 5\' 1"  (1.549 m)     Head Circumference      Peak Flow      Pain Score 2      Pain Loc      Pain Edu?      Excl. in [place]?      Physical Exam Vitals signs and nursing note reviewed.  Constitutional:      General: She is not in acute distress.    Appearance: She is well-developed.  HENT:     Head: Normocephalic and atraumatic.     Comments: Mild posterior pharyngeal erythema Eyes:     Conjunctiva/sclera: Conjunctivae normal.  Neck:     Musculoskeletal: Neck supple.  Cardiovascular:     Rate and Rhythm: Normal rate and regular rhythm.     Heart sounds: Normal heart sounds. No murmur. No friction rub.  Pulmonary:     Effort: Pulmonary effort is normal. Tachypnea present. No respiratory distress.     Breath sounds: Examination of the right-lower field reveals rales. Examination of the left-lower field reveals rales. Rales present. No wheezing.  Abdominal:     General: There is no distension.     Palpations: Abdomen is soft.     Tenderness: There is no abdominal tenderness.  Skin:    General: Skin is warm.     Capillary Refill: Capillary refill takes less than 2 seconds.  Neurological:     Mental Status: She is alert and oriented to person, place, and time.     Motor: No abnormal muscle tone.       ____________________________________________   LABS (all labs ordered are listed, but only abnormal results are displayed)  Labs Reviewed  COMPREHENSIVE METABOLIC PANEL - Abnormal; Notable for the following components:      Result Value   Sodium 133 (*)    Glucose, Bld 112 (*)    Calcium 8.8 (*)    Total Protein 8.2 (*)    All other components within normal limits  NOVEL CORONAVIRUS, NAA (HOSP ORDER, SEND-OUT TO REF LAB; TAT 18-24 HRS)  CBC WITH DIFFERENTIAL/PLATELET  POC URINE PREG, ED  POCT PREGNANCY, URINE    ____________________________________________  EKG: Normal sinus rhythm, ventricular 94.  PR 129, QRS 87, QTc 448.  No acute ST or T-segment change. ________________________________________  RADIOLOGY All imaging, including plain films,  CT scans, and ultrasounds, independently reviewed by me, and interpretations confirmed via formal radiology reads.  ED MD interpretation:   CXR: Bibasilar atelectasis  Official radiology report(s): Dg Chest 2 View  Result Date: 07/23/2019 CLINICAL DATA:  Shortness of breath EXAM: CHEST - 2 VIEW COMPARISON:  07/23/2019 FINDINGS: Low volumes. Bibasilar atelectasis. Heart is normal size. No effusions or acute bony abnormality. IMPRESSION: Low volumes with bibasilar atelectasis. Electronically Signed   By: Rolm Baptise M.D.   On: 07/23/2019 15:29    ____________________________________________  PROCEDURES   Procedure(s)  performed (including Critical Care):  Procedures  ____________________________________________  INITIAL IMPRESSION / MDM / ASSESSMENT AND PLAN / ED COURSE  As part of my medical decision making, I reviewed the following data within the electronic MEDICAL RECORD NUMBER Notes from prior ED visits and [place] Controlled Substance Database      *Emri P Delacruz was evaluated in Emergency Department on 07/23/2019 for the symptoms described in the history of present illness. She was evaluated in the context of the global COVID-19 pandemic, which necessitated consideration that the patient might be at risk for infection with the SARS-CoV-2 virus that causes COVID-19. Institutional protocols and algorithms that pertain to the evaluation of patients at risk for COVID-19 are in a state of rapid change based on information released by regulatory bodies including the CDC and federal and state organizations. These policies and algorithms were followed during the patient's care in the ED.  Some ED evaluations and interventions may be delayed as a result of limited staffing during the pandemic.*   Clinical Course as of Jul 22 1748  Tue Jul 23, 2019  1701 28 yo F here with cough, SOB, fever. Suspect COVID-19, DDx includes early CAP given bibasilar atelectasis on PNA, viral PNA. No LE  edema, swelling, TTP, and tachycardia is likely 2/2 her fever with otherwise normal sats, no OCP use, do not suspect DVT/PE clinically. EKG is non-ischemic. Satting well oN RA in NAD. Will treat for early CAP/viral PNA, give dose of decadron and trial inhaler. Home quarantine instructions provided with good return precautions. Encouraged fluids.   [CI]    Clinical Course User Index [CI] Shaune PollackIsaacs, Meloney Feld, MD    Medical Decision Making: As above.  ____________________________________________  FINAL CLINICAL IMPRESSION(S) / ED DIAGNOSES  Final diagnoses:  SOB (shortness of breath)  Community acquired pneumonia, unspecified laterality     MEDICATIONS GIVEN DURING THIS VISIT:  Medications  dexamethasone (DECADRON) tablet 10 mg (has no administration in time range)  albuterol (VENTOLIN HFA) 108 (90 Base) MCG/ACT inhaler 2 puff (has no administration in time range)  ibuprofen (ADVIL) tablet 600 mg (600 mg Oral Given 07/23/19 1710)     ED Discharge Orders         Ordered    azithromycin (ZITHROMAX Z-PAK) 250 MG tablet     07/23/19 1658    benzonatate (TESSALON PERLES) 100 MG capsule  3 times daily PRN     07/23/19 1658    amoxicillin-clavulanate (AUGMENTIN) 875-125 MG tablet  2 times daily     07/23/19 1658           Note:  This document was prepared using Dragon voice recognition software and may include unintentional dictation errors.   Shaune PollackIsaacs, Duanne Duchesne, MD 07/23/19 1750

## 2019-07-23 NOTE — ED Notes (Signed)
Pharmacy reporting the inhaler is almost ready to be sent to the ED.

## 2019-07-23 NOTE — ED Notes (Signed)
MD at bedside. 

## 2019-07-24 LAB — NOVEL CORONAVIRUS, NAA (HOSP ORDER, SEND-OUT TO REF LAB; TAT 18-24 HRS): SARS-CoV-2, NAA: DETECTED — AB

## 2019-07-24 NOTE — ED Notes (Signed)
Attempted to contact pt with + COVID results without answer. No Voicemail left due to lack of identification in Voicemail box.

## 2019-07-25 ENCOUNTER — Telehealth: Payer: Self-pay | Admitting: Emergency Medicine

## 2019-07-25 NOTE — Telephone Encounter (Signed)
Called pateint to assure she is aware of positve covid 19 result.  She is aware.

## 2021-11-13 ENCOUNTER — Emergency Department: Payer: BC Managed Care – PPO

## 2021-11-13 ENCOUNTER — Ambulatory Visit
Admission: EM | Admit: 2021-11-13 | Discharge: 2021-11-13 | Disposition: A | Discharge status: Home / Self Care | Payer: BC Managed Care – PPO | Attending: Emergency Medicine | Admitting: Emergency Medicine

## 2021-11-13 ENCOUNTER — Other Ambulatory Visit: Payer: Self-pay

## 2021-11-13 ENCOUNTER — Emergency Department
Admission: EM | Admit: 2021-11-13 | Discharge: 2021-11-13 | Disposition: A | Discharge status: Home / Self Care | Payer: BC Managed Care – PPO | Attending: Emergency Medicine | Admitting: Emergency Medicine

## 2021-11-13 ENCOUNTER — Encounter: Payer: Self-pay | Admitting: Emergency Medicine

## 2021-11-13 DIAGNOSIS — R221 Localized swelling, mass and lump, neck: Secondary | ICD-10-CM

## 2021-11-13 DIAGNOSIS — J029 Acute pharyngitis, unspecified: Secondary | ICD-10-CM | POA: Diagnosis present

## 2021-11-13 DIAGNOSIS — R059 Cough, unspecified: Secondary | ICD-10-CM | POA: Insufficient documentation

## 2021-11-13 DIAGNOSIS — J039 Acute tonsillitis, unspecified: Secondary | ICD-10-CM | POA: Insufficient documentation

## 2021-11-13 DIAGNOSIS — D72829 Elevated white blood cell count, unspecified: Secondary | ICD-10-CM | POA: Diagnosis not present

## 2021-11-13 DIAGNOSIS — Z20822 Contact with and (suspected) exposure to covid-19: Secondary | ICD-10-CM | POA: Insufficient documentation

## 2021-11-13 DIAGNOSIS — R220 Localized swelling, mass and lump, head: Secondary | ICD-10-CM | POA: Diagnosis not present

## 2021-11-13 LAB — CBC WITH DIFFERENTIAL/PLATELET
Abs Immature Granulocytes: 0.07 10*3/uL (ref 0.00–0.07)
Basophils Absolute: 0.1 10*3/uL (ref 0.0–0.1)
Basophils Relative: 1 %
Eosinophils Absolute: 0.5 10*3/uL (ref 0.0–0.5)
Eosinophils Relative: 4 %
HCT: 41.1 % (ref 36.0–46.0)
Hemoglobin: 14 g/dL (ref 12.0–15.0)
Immature Granulocytes: 1 %
Lymphocytes Relative: 31 %
Lymphs Abs: 3.9 10*3/uL (ref 0.7–4.0)
MCH: 30.4 pg (ref 26.0–34.0)
MCHC: 34.1 g/dL (ref 30.0–36.0)
MCV: 89.2 fL (ref 80.0–100.0)
Monocytes Absolute: 0.9 10*3/uL (ref 0.1–1.0)
Monocytes Relative: 8 %
Neutro Abs: 6.9 10*3/uL (ref 1.7–7.7)
Neutrophils Relative %: 55 %
Platelets: 318 10*3/uL (ref 150–400)
RBC: 4.61 MIL/uL (ref 3.87–5.11)
RDW: 12.7 % (ref 11.5–15.5)
WBC: 12.3 10*3/uL — ABNORMAL HIGH (ref 4.0–10.5)
nRBC: 0 % (ref 0.0–0.2)

## 2021-11-13 LAB — BASIC METABOLIC PANEL
Anion gap: 7 (ref 5–15)
BUN: 12 mg/dL (ref 6–20)
CO2: 24 mmol/L (ref 22–32)
Calcium: 9.7 mg/dL (ref 8.9–10.3)
Chloride: 102 mmol/L (ref 98–111)
Creatinine, Ser: 0.55 mg/dL (ref 0.44–1.00)
GFR, Estimated: >60 mL/min (ref >60)
Glucose, Bld: 101 mg/dL — ABNORMAL HIGH (ref 70–99)
Potassium: 3.9 mmol/L (ref 3.5–5.1)
Sodium: 133 mmol/L — ABNORMAL LOW (ref 135–145)

## 2021-11-13 LAB — GROUP A STREP BY PCR: Group A Strep by PCR: NOT DETECTED

## 2021-11-13 LAB — RESP PANEL BY RT-PCR (FLU A&B, COVID) ARPGX2
Influenza A by PCR: NEGATIVE
Influenza B by PCR: NEGATIVE
SARS Coronavirus 2 by RT PCR: NEGATIVE

## 2021-11-13 MED ORDER — IOHEXOL 300 MG/ML  SOLN
75.0000 mL | Freq: Once | INTRAMUSCULAR | Status: AC | PRN
Start: 2021-11-13 — End: 2021-11-13
  Administered 2021-11-13: 75 mL via INTRAVENOUS
  Filled 2021-11-13: qty 75

## 2021-11-13 MED ORDER — DEXAMETHASONE 1 MG/ML PO CONC
10.0000 mg | Freq: Once | ORAL | Status: AC
  Administered 2021-11-13: 10 mg via ORAL
  Filled 2021-11-13: qty 10

## 2021-11-13 NOTE — ED Triage Notes (Signed)
Pt reports sore throat and cough for 4 days. Pt states fever the first couple of days but now just bad sore throat

## 2021-11-13 NOTE — ED Provider Notes (Signed)
°  Physical Exam  BP 128/86 (BP Location: Left Arm)    Pulse 82    Temp 98.5 F (36.9 C) (Oral)    Resp 20    Ht 5\' 2"  (1.575 m)    Wt 82 kg    LMP 08/18/2021 (Approximate)    SpO2 95%    BMI 33.06 kg/m   Physical Exam  ED Course/Procedures     Procedures  MDM    Assumed patient care for 08/20/2021, PA-C.  CT soft tissue neck shows no acute abnormality.  Patient appropriate for discharge.      Charlsie Quest [place], PA-C 11/13/21 2148    2149, MD 11/16/21 [phone]

## 2021-11-13 NOTE — ED Notes (Signed)
Patient is being discharged from the Urgent Care and sent to the Emergency Department via private vehicle . Per Dr. Chaney Malling, patient is in need of higher level of care due to needing CT scan. Patient is aware and verbalizes understanding of plan of care.  Vitals:   11/13/21 1311  BP: 124/86  Pulse: 80  Resp: 16  Temp: 98.4 F (36.9 C)  SpO2: 100%

## 2021-11-13 NOTE — ED Provider Notes (Signed)
HPI  SUBJECTIVE:  Tammy Delacruz is a 30 y.o. female who presents with 3 to 4 days of worsening bilateral swelling underneath her jaw and underneath her tongue.  She states that it is getting bigger.  It does not get bigger and smaller.  No fevers above 100.4, voice changes, sensation of throat swelling shut, difficulty breathing, trismus, neck stiffness, dental pain or trauma, recent dental work.  She states that the pain radiates to both of her ears.  This is never happened before.  No antipyretic in the past 6 hours.  She has been taking 400 mg of ibuprofen and 1000 mg of Tylenol, doing warm compresses, which are no longer working.  No aggravating factors.  She has no past medical history.  LMP: Irregular.  Denies the possibility of being pregnant.  PMD: None.  Past Medical History:  Diagnosis Date   Complication of anesthesia    nausea   Medical history non-contributory     Past Surgical History:  Procedure Laterality Date   CHOLECYSTECTOMY     DILATION AND CURETTAGE OF UTERUS     DILATION AND EVACUATION N/A 12/09/2016   Procedure: DILATATION AND EVACUATION;  Surgeon: Herold Harms, MD;  Location: [facility] ORS;  Service: Gynecology;  Laterality: N/A;   LAPAROSCOPY N/A 12/09/2016   Procedure: LAPAROSCOPY WITH EXCISION TUBAL PREGNANCY;  Surgeon: Herold Harms, MD;  Location: [facility] ORS;  Service: Gynecology;  Laterality: N/A;   UNILATERAL SALPINGECTOMY Right    partial-    Family History  Problem Relation Age of Onset   Diabetes Maternal Grandmother    Breast cancer Neg Hx    Ovarian cancer Neg Hx    Colon cancer Neg Hx    Heart disease Neg Hx    Healthy Mother    Healthy Father     Social History   Tobacco Use   Smoking status: Never   Smokeless tobacco: Never  Vaping Use   Vaping Use: Never used  Substance Use Topics   Alcohol use: Yes    Comment: occasionally   Drug use: No    No current facility-administered medications for this  encounter. No current outpatient medications on file.  No Known Allergies   ROS  As noted in HPI.   Physical Exam  BP 124/86 (BP Location: Left Arm)    Pulse 80    Temp 98.4 F (36.9 C) (Oral)    Resp 16    Ht 5\' 2"  (1.575 m)    Wt 81.6 kg    SpO2 100%    BMI 32.92 kg/m   Constitutional: Well developed, well nourished, no acute distress Eyes:  EOMI, conjunctiva normal bilaterally HENT: Normocephalic, atraumatic,mucus membranes moist.  Positive tenderness underneath the jaw bilaterally.  No appreciable swelling, induration, overlying erythema.  No tenderness along the parotid gland.  Questionable tenderness of the submandibular glands worse on the right.  No swelling of the tongue floor.  No expressible purulent drainage from Wharton's duct.  Dentition normal, nontender, no gingival swelling.  No drooling, trismus.  No neck stiffness. Neck: No cervical lymphadenopathy Respiratory: Normal inspiratory effort Cardiovascular: Normal rate GI: nondistended skin: No rash, skin intact Musculoskeletal: no deformities Neurologic: Alert & oriented x 3, no focal neuro deficits Psychiatric: Speech and behavior appropriate   ED Course   Medications - No data to display  No orders of the defined types were placed in this encounter.   No results found for this or any previous visit (from the past 24 hour(s)).  No results found.  ED Clinical Impression  1. Swelling of submandibular region      ED Assessment/Plan  There does not appear to be any impending airway issue.  However, in the differential is a blocked or infected submandibular salivary gland, submandibular abscess, early Ludwig's angina.  Transferring to the ED for further evaluation and possible imaging.  CT is not available here today.  Feel that she is stable to go by private vehicle.  Discussed medical decision making, rationale for transfer to the emergency department.  She agrees to go.  No orders of the defined types  were placed in this encounter.     *This clinic note was created using Dragon dictation software. Therefore, there may be occasional mistakes despite careful proofreading.  ?    Domenick Gong, MD 11/13/21 1546

## 2021-11-13 NOTE — ED Triage Notes (Signed)
Pt here with C/O ear pain and swelling under saw and under tongue for 3 days.

## 2021-11-13 NOTE — Discharge Instructions (Addendum)
This could be a blocked salivary gland, salivary gland infection, abscess, or very early Ludewig's angina.  I feel that we need further information to make sure that you are safe to go home.  Please go to the emergency department.  Let them know if you have difficulty breathing, if you feel like your throat is swelling shut, neck stiffness, difficulty opening your jaw, or for other concerns

## 2021-11-13 NOTE — ED Provider Notes (Signed)
[facility] Emergency Department Provider Note    ____________________________________________   Event Date/Time   First MD Initiated Contact with Patient 11/13/21 1657     (approximate)  I have reviewed the triage vital signs and the nursing notes.   HISTORY  Chief Complaint Cough and Sore Throat    HPI Tammy Delacruz is a 30 y.o. female, no remarkable medical history, presents to the emergency department for evaluation of sore throat and cough.  Patient states that she has been experiencing a sore throat for the past 4 days with associated fever/chills.  Coughing intermittently.  She states that she can feel pain specifically underneath her jaw on both the left and the right side.  Endorses difficulty swallowing due to pain.  Denies trismus, chest pain, shortness of breath, abdominal pain, back pain, visual changes, or urinary symptoms.  She was seen at urgent care earlier today who expressed concern for possible Ludwig angina.   History limited by: No limitations.  Past Medical History:  Diagnosis Date   Complication of anesthesia    nausea   Medical history non-contributory     Patient Active Problem List   Diagnosis Date Noted   S/P D&C (status post dilation and curettage) 12/09/2016   S/P laparoscopy 12/09/2016   Abnormal pregnancy in first trimester 12/06/2016   Threatened abortion 12/01/2016    Past Surgical History:  Procedure Laterality Date   CHOLECYSTECTOMY     DILATION AND CURETTAGE OF UTERUS     DILATION AND EVACUATION N/A 12/09/2016   Procedure: DILATATION AND EVACUATION;  Surgeon: Brayton Mars, MD;  Location: [facility] ORS;  Service: Gynecology;  Laterality: N/A;   LAPAROSCOPY N/A 12/09/2016   Procedure: LAPAROSCOPY WITH EXCISION TUBAL PREGNANCY;  Surgeon: Brayton Mars, MD;  Location: [facility] ORS;  Service: Gynecology;  Laterality: N/A;   UNILATERAL SALPINGECTOMY Right    partial-    Prior to Admission  medications   Not on File    Allergies Patient has no known allergies.  Family History  Problem Relation Age of Onset   Diabetes Maternal Grandmother    Breast cancer Neg Hx    Ovarian cancer Neg Hx    Colon cancer Neg Hx    Heart disease Neg Hx    Healthy Mother    Healthy Father     Social History Social History   Tobacco Use   Smoking status: Never   Smokeless tobacco: Never  Vaping Use   Vaping Use: Never used  Substance Use Topics   Alcohol use: Yes    Comment: occasionally   Drug use: No    Review of Systems  Constitutional: Positive for fever/chills.  Negative for weight loss, or fatigue.  Eyes: Negative for visual changes or discharge.  ENT: Positive for sinus congestion and sore throat.  Negative for hearing changes. Gastrointestinal: Negative for abdominal pain, nausea/vomiting, or diarrhea.  Genitourinary: Negative for dysuria or hematuria.  Musculoskeletal: Negative for back pain or joint pain.  Skin: Negative for rashes or lesions.  Neurological: Negative for headache, syncope, dizziness, tremors, or numbness/tingling.   10-point ROS otherwise negative. ____________________________________________   PHYSICAL EXAM:  VITAL SIGNS: ED Triage Vitals  Enc Vitals Group     BP 11/13/21 1649 128/86     Pulse Rate 11/13/21 1649 82     Resp 11/13/21 1649 20     Temp 11/13/21 1649 98.5 F (36.9 C)     Temp Source 11/13/21 1649 Oral     SpO2 11/13/21  1649 95 %     Weight 11/13/21 1644 180 lb 12.4 oz (82 kg)     Height 11/13/21 1644 5\' 2"  (1.575 m)     Head Circumference --      Peak Flow --      Pain Score 11/13/21 1644 8     Pain Loc --      Pain Edu? --      Excl. in [place]? --     Physical Exam Constitutional:      General: She is not in acute distress.    Appearance: She is well-developed. She is not ill-appearing.  HENT:     Head: Normocephalic and atraumatic.     Nose: Congestion present.     Mouth/Throat:     Mouth: Mucous membranes are  moist. No oral lesions.     Pharynx: Oropharynx is clear. Uvula midline. No uvula swelling.     Comments: Throat is erythematous.  Significant tonsillar swelling noted bilaterally.  Unable to visualize presents/absence of exudates.  No remarkable swelling under the tongue.  No active bleeding or discharge. Eyes:     Conjunctiva/sclera: Conjunctivae normal.     Pupils: Pupils are equal, round, and reactive to light.  Neck:     Comments: Anterior cervical lymphadenopathy present Cardiovascular:     Rate and Rhythm: Normal rate and regular rhythm.  Pulmonary:     Effort: Pulmonary effort is normal.     Breath sounds: Normal breath sounds.  Abdominal:     General: Bowel sounds are normal.     Palpations: Abdomen is soft.  Musculoskeletal:     Cervical back: Normal range of motion and neck supple.  Skin:    General: Skin is warm and dry.  Neurological:     Mental Status: She is alert and oriented to person, place, and time.  Psychiatric:        Mood and Affect: Mood normal.        Behavior: Behavior normal.     ____________________________________________    LABS  (all labs ordered are listed, but only abnormal results are displayed)  Labs Reviewed  CBC WITH DIFFERENTIAL/PLATELET - Abnormal; Notable for the following components:      Result Value   WBC 12.3 (*)    All other components within normal limits  BASIC METABOLIC PANEL - Abnormal; Notable for the following components:   Sodium 133 (*)    Glucose, Bld 101 (*)    All other components within normal limits  RESP PANEL BY RT-PCR (FLU A&B, COVID) ARPGX2  GROUP A STREP BY PCR     ____________________________________________   EKG Not applicable.   ____________________________________________    RADIOLOGY  CT Soft Tissue Neck W Contrast  Result Date: 11/13/2021 CLINICAL DATA:  Epiglottitis or tonsillitis suspected, swelling underneath jaw EXAM: CT NECK WITH CONTRAST TECHNIQUE: Multidetector CT imaging of  the neck was performed using the standard protocol following the bolus administration of intravenous contrast. CONTRAST:  72mL OMNIPAQUE IOHEXOL 300 MG/ML  SOLN COMPARISON:  None. FINDINGS: Pharynx and larynx: No definite enlargement or hyperenhancement of the bilateral palatine tonsils. The epiglottis is unremarkable. No mass or swelling. No focal abnormality is seen in the tongue or sublingual musculature. Salivary glands: No inflammation, mass, or stone. Thyroid: Normal. Lymph nodes: Prominent left-greater-than-right level upper cervical lymph nodes, the largest of which is a left level 2A lymph node measuring up to 1.5 cm in short axis (series 2, image 35). Prominent bilateral level 1 B lymph nodes,  which are within normal limits for size and maintain their reniform morphology., prominent bilateral level 3 lymph nodes, which measure up to 0.9 cm in short axis (series 2, image 50 and 53). Vascular: Negative. Limited intracranial: Negative. Visualized orbits: Negative. Mastoids and visualized paranasal sinuses: Clear. Skeleton: Reversal of the normal cervical lordosis, which may be positional. No acute osseous abnormality. Upper chest: Negative. Other: Normal appearance of the soft tissues of the face. No focal collection or significant edema. IMPRESSION: 1. No evidence of tonsillitis or epiglottitis. No focal abnormality is seen in the tongue, sublingual musculature, or soft tissues of the face. 2. Prominent cervical lymph nodes, which are favored to be reactive and may indicate an infectious or inflammatory process without significant imaging findings. Electronically Signed   By: Wiliam Ke M.D.   On: 11/13/2021 21:08    ____________________________________________   PROCEDURES  Procedures   Medications  dexamethasone (DECADRON) 1 MG/ML solution 10 mg (10 mg Oral Given 11/13/21 1942)  iohexol (OMNIPAQUE) 300 MG/ML solution 75 mL (75 mLs Intravenous Contrast Given 11/13/21 2013)    Critical Care  performed: No  ____________________________________________   INITIAL IMPRESSION / ASSESSMENT AND PLAN / ED COURSE  Pertinent labs & imaging results that were available during my care of the patient were reviewed by me and considered in my medical decision making (see chart for details).       Tammy Delacruz is a 30 y.o. female, no remarkable medical history, presents to the emergency department for evaluation of sore throat and cough.  Patient states that she has been experiencing a sore throat for the past 4 days with associated fever/chills.  Coughing intermittently.  She states that she can feel pain specifically underneath her jaw on both the left and the right side.  Endorses difficulty swallowing due to pain.  Denies trismus, chest pain, shortness of breath, abdominal pain, back pain, visual changes, or urinary symptoms.  She was seen at urgent care earlier today who expressed concern for possible Ludwig angina.  Differentials include, but not limited to: Serious: epiglottitis, peritonsillar abscess, retropharyngeal abscess, Ludwig angina, gonorrhea/chlamydia Common: viral pharyngitis, strep pharyngitis, mononucleosis, allergies, viral syndrome (influenza, COVID), reflux esophagitis   Upon entering the room, patient appears well.  She is sitting upright comfortably in bed in no apparent distress.  Vital signs are within normal limits.  Physical exam significant for tonsillar swelling with some exudates.  No uvula deviation. Additionally, anterior cervical lymphadenopathy is present.  No masses swelling under the tongue.  No active bleeding or discharge.  CBC notable for a leukocytosis of 12.3.  BMP unremarkable.  Respiratory panel negative for COVID and influenza.  Group A strep negative.  CT soft tissue neck shows prominent cervical nodes, but no evidence of masses, abscesses, or epiglottitis.   Given the patient's history, physical exam, labs, and imaging, I suspect that  the patient is experiencing noncomplicated tonsillitis.  Patient was given dexamethasone here in the emergency department.  No further work-up or treatment indicated at this time.  We will plan to discharge this patient home with strict return precautions and anticipatory guidance.     ____________________________________________   FINAL CLINICAL IMPRESSION(S) / ED DIAGNOSES  Final diagnoses:  Tonsillitis     NEW MEDICATIONS STARTED DURING THIS VISIT:  ED Discharge Orders     None        Note:  This document was prepared using Dragon voice recognition software and may include unintentional dictation errors.    Charlsie Quest  [place], [place] 11/14/21 [id number]    Vanessa Boneau, MD 11/16/21 1134

## 2024-03-21 NOTE — Patient Instructions (Signed)
 Preventive Care 55-31 Years Old, Female Preventive care refers to lifestyle choices and visits with your health care provider that can promote health and wellness. Preventive care visits are also called wellness exams. What can I expect for my preventive care visit? Counseling During your preventive care visit, your health care provider may ask about your: Medical history, including: Past medical problems. Family medical history. Pregnancy history. Current health, including: Menstrual cycle. Method of birth control. Emotional well-being. Home life and relationship well-being. Sexual activity and sexual health. Lifestyle, including: Alcohol, nicotine or tobacco, and drug use. Access to firearms. Diet, exercise, and sleep habits. Work and work Astronomer. Sunscreen use. Safety issues such as seatbelt and bike helmet use. Physical exam Your health care provider may check your: Height and weight. These may be used to calculate your BMI (body mass index). BMI is a measurement that tells if you are at a healthy weight. Waist circumference. This measures the distance around your waistline. This measurement also tells if you are at a healthy weight and may help predict your risk of certain diseases, such as type 2 diabetes and high blood pressure. Heart rate and blood pressure. Body temperature. Skin for abnormal spots. What immunizations do I need?  Vaccines are usually given at various ages, according to a schedule. Your health care provider will recommend vaccines for you based on your age, medical history, and lifestyle or other factors, such as travel or where you work. What tests do I need? Screening Your health care provider may recommend screening tests for certain conditions. This may include: Pelvic exam and Pap test. Lipid and cholesterol levels. Diabetes screening. This is done by checking your blood sugar (glucose) after you have not eaten for a while (fasting). Hepatitis  B test. Hepatitis C test. HIV (human immunodeficiency virus) test. STI (sexually transmitted infection) testing, if you are at risk. BRCA-related cancer screening. This may be done if you have a family history of breast, ovarian, tubal, or peritoneal cancers. Talk with your health care provider about your test results, treatment options, and if necessary, the need for more tests. Follow these instructions at home: Eating and drinking  Eat a healthy diet that includes fresh fruits and vegetables, whole grains, lean protein, and low-fat dairy products. Take vitamin and mineral supplements as recommended by your health care provider. Do not drink alcohol if: Your health care provider tells you not to drink. You are pregnant, may be pregnant, or are planning to become pregnant. If you drink alcohol: Limit how much you have to 0-1 drink a day. Know how much alcohol is in your drink. In the [place] one drink equals one 12 oz bottle of beer (355 mL), one 5 oz glass of wine (148 mL), or one 1 oz glass of hard liquor (44 mL). Lifestyle Brush your teeth every morning and night with fluoride toothpaste. Floss one time each day. Exercise for at least 30 minutes 5 or more days each week. Do not use any products that contain nicotine or tobacco. These products include cigarettes, chewing tobacco, and vaping devices, such as e-cigarettes. If you need help quitting, ask your health care provider. Do not use drugs. If you are sexually active, practice safe sex. Use a condom or other form of protection to prevent STIs. If you do not wish to become pregnant, use a form of birth control. If you plan to become pregnant, see your health care provider for a prepregnancy visit. Find healthy ways to manage stress, such as: Meditation,  yoga, or listening to music. Journaling. Talking to a trusted person. Spending time with friends and family. Minimize exposure to UV radiation to reduce your risk of skin  cancer. Safety Always wear your seat belt while driving or riding in a vehicle. Do not drive: If you have been drinking alcohol. Do not ride with someone who has been drinking. If you have been using any mind-altering substances or drugs. While texting. When you are tired or distracted. Wear a helmet and other protective equipment during sports activities. If you have firearms in your house, make sure you follow all gun safety procedures. Seek help if you have been physically or sexually abused. What's next? Go to your health care provider once a year for an annual wellness visit. Ask your health care provider how often you should have your eyes and teeth checked. Stay up to date on all vaccines. This information is not intended to replace advice given to you by your health care provider. Make sure you discuss any questions you have with your health care provider. Document Revised: 05/12/2021 Document Reviewed: 05/12/2021 Elsevier Patient Education  2024 [employer] Breast Self-Awareness Breast self-awareness is knowing how your breasts look and feel. You need to: Check your breasts on a regular basis. Tell your doctor about any changes. Become familiar with the look and feel of your breasts. This can help you catch a breast problem while it is still small and can be treated. You should do breast self-exams even if you have breast implants. What you need: A mirror. A well-lit room. A pillow or other soft object. How to do a breast self-exam Follow these steps to do a breast self-exam: Look for changes  Take off all the clothes above your waist. Stand in front of a mirror in a room with good lighting. Put your hands down at your sides. Compare your breasts in the mirror. Look for any difference between them, such as: A difference in shape. A difference in size. Wrinkles, dips, and bumps in one breast and not the other. Look at each breast for changes in the skin, such  as: Redness. Scaly areas. Skin that has gotten thicker. Dimpling. Open sores (ulcers). Look for changes in your nipples, such as: Fluid coming out of a nipple. Fluid around a nipple. Bleeding. Dimpling. Redness. A nipple that looks pushed in (retracted), or that has changed position. Feel for changes Lie on your back. Feel each breast. To do this: Pick a breast to feel. Place a pillow under the shoulder closest to that breast. Put the arm closest to that breast behind your head. Feel the nipple area of that breast using the hand of your other arm. Feel the area with the pads of your three middle fingers by making small circles with your fingers. Use light, medium, and firm pressure. Continue the overlapping circles, moving downward over the breast. Keep making circles with your fingers. Stop when you feel your ribs. Start making circles with your fingers again, this time going upward until you reach your collarbone. Then, make circles outward across your breast and into your armpit area. Squeeze your nipple. Check for discharge and lumps. Repeat these steps to check your other breast. Sit or stand in the tub or shower. With soapy water on your skin, feel each breast the same way you did when you were lying down. Write down what you find Writing down what you find can help you remember what to tell your doctor. Write down: What is  normal for each breast. Any changes you find in each breast. These include: The kind of changes you find. A tender or painful breast. Any lump you find. Write down its size and where it is. When you last had your monthly period (menstrual cycle). General tips If you are breastfeeding, the best time to check your breasts is after you feed your baby or after you use a breast pump. If you get monthly bleeding, the best time to check your breasts is 5-7 days after your monthly cycle ends. With time, you will become comfortable with the self-exam. You will  also start to know if there are changes in your breasts. Contact a doctor if: You see a change in the shape or size of your breasts or nipples. You see a change in the skin of your breast or nipples, such as red or scaly skin. You have fluid coming from your nipples that is not normal. You find a new lump or thick area. You have breast pain. You have any concerns about your breast health. Summary Breast self-awareness includes looking for changes in your breasts and feeling for changes within your breasts. You should do breast self-awareness in front of a mirror in a well-lit room. If you get monthly periods (menstrual cycles), the best time to check your breasts is 5-7 days after your period ends. Tell your doctor about any changes you see in your breasts. Changes include changes in size, changes on the skin, painful or tender breasts, or fluid from your nipples that is not normal. This information is not intended to replace advice given to you by your health care provider. Make sure you discuss any questions you have with your health care provider. Document Revised: 04/21/2022 Document Reviewed: 09/16/2021 Elsevier Patient Education  2024 [employer].

## 2024-03-22 ENCOUNTER — Ambulatory Visit: Payer: Self-pay | Admitting: Certified Nurse Midwife

## 2024-03-22 ENCOUNTER — Other Ambulatory Visit (HOSPITAL_COMMUNITY)
Admission: RE | Admit: 2024-03-22 | Discharge: 2024-03-22 | Disposition: A | Discharge status: Home / Self Care | Source: Ambulatory Visit | Attending: Certified Nurse Midwife | Admitting: Certified Nurse Midwife

## 2024-03-22 ENCOUNTER — Encounter: Payer: Self-pay | Admitting: Certified Nurse Midwife

## 2024-03-22 VITALS — BP 127/77 | HR 87 | Ht 62.0 in | Wt 202.3 lb

## 2024-03-22 DIAGNOSIS — B9689 Other specified bacterial agents as the cause of diseases classified elsewhere: Secondary | ICD-10-CM | POA: Insufficient documentation

## 2024-03-22 DIAGNOSIS — N915 Oligomenorrhea, unspecified: Secondary | ICD-10-CM

## 2024-03-22 DIAGNOSIS — Z01419 Encounter for gynecological examination (general) (routine) without abnormal findings: Secondary | ICD-10-CM

## 2024-03-22 DIAGNOSIS — Z124 Encounter for screening for malignant neoplasm of cervix: Secondary | ICD-10-CM | POA: Diagnosis not present

## 2024-03-22 DIAGNOSIS — Z13220 Encounter for screening for lipoid disorders: Secondary | ICD-10-CM

## 2024-03-22 DIAGNOSIS — Z131 Encounter for screening for diabetes mellitus: Secondary | ICD-10-CM

## 2024-03-22 DIAGNOSIS — N730 Acute parametritis and pelvic cellulitis: Secondary | ICD-10-CM | POA: Diagnosis not present

## 2024-03-22 DIAGNOSIS — Z1159 Encounter for screening for other viral diseases: Secondary | ICD-10-CM

## 2024-03-22 DIAGNOSIS — Z3202 Encounter for pregnancy test, result negative: Secondary | ICD-10-CM

## 2024-03-22 DIAGNOSIS — Z114 Encounter for screening for human immunodeficiency virus [HIV]: Secondary | ICD-10-CM

## 2024-03-22 DIAGNOSIS — N926 Irregular menstruation, unspecified: Secondary | ICD-10-CM

## 2024-03-22 DIAGNOSIS — Z113 Encounter for screening for infections with a predominantly sexual mode of transmission: Secondary | ICD-10-CM | POA: Diagnosis not present

## 2024-03-22 DIAGNOSIS — R7309 Other abnormal glucose: Secondary | ICD-10-CM

## 2024-03-22 DIAGNOSIS — E782 Mixed hyperlipidemia: Secondary | ICD-10-CM

## 2024-03-22 LAB — POCT URINE PREGNANCY: Preg Test, Ur: NEGATIVE

## 2024-03-22 MED ORDER — NORGESTIMATE-ETH ESTRADIOL 0.25-35 MG-MCG PO TABS: 1.0000 | ORAL_TABLET | Freq: Every day | ORAL | 2 refills | Status: AC

## 2024-03-23 LAB — COMPREHENSIVE METABOLIC PANEL WITH GFR
ALT: 69 IU/L — ABNORMAL HIGH (ref 0–32)
AST: 44 IU/L — ABNORMAL HIGH (ref 0–40)
Albumin: 4.7 g/dL (ref 3.9–4.9)
Alkaline Phosphatase: 92 IU/L (ref 44–121)
BUN/Creatinine Ratio: 9 (ref 9–23)
BUN: 7 mg/dL (ref 6–20)
Bilirubin Total: 0.8 mg/dL (ref 0.0–1.2)
CO2: 21 mmol/L (ref 20–29)
Calcium: 9.7 mg/dL (ref 8.7–10.2)
Chloride: 104 mmol/L (ref 96–106)
Creatinine, Ser: 0.75 mg/dL (ref 0.57–1.00)
Globulin, Total: 2.9 g/dL (ref 1.5–4.5)
Glucose: 82 mg/dL (ref 70–99)
Potassium: 4.4 mmol/L (ref 3.5–5.2)
Sodium: 142 mmol/L (ref 134–144)
Total Protein: 7.6 g/dL (ref 6.0–8.5)
eGFR: 109 mL/min/{1.73_m2} (ref >59)

## 2024-03-23 LAB — CBC
Hematocrit: 42.8 % (ref 34.0–46.6)
Hemoglobin: 14.3 g/dL (ref 11.1–15.9)
MCH: 30.5 pg (ref 26.6–33.0)
MCHC: 33.4 g/dL (ref 31.5–35.7)
MCV: 91 fL (ref 79–97)
Platelets: 288 10*3/uL (ref 150–450)
RBC: 4.69 x10E6/uL (ref 3.77–5.28)
RDW: 12.9 % (ref 11.7–15.4)
WBC: 9.3 10*3/uL (ref 3.4–10.8)

## 2024-03-23 LAB — LIPID PANEL
Chol/HDL Ratio: 6.1 ratio — ABNORMAL HIGH (ref 0.0–4.4)
Cholesterol, Total: 194 mg/dL (ref 100–199)
HDL: 32 mg/dL — ABNORMAL LOW (ref >39)
LDL Chol Calc (NIH): 129 mg/dL — ABNORMAL HIGH (ref 0–99)
Triglycerides: 183 mg/dL — ABNORMAL HIGH (ref 0–149)
VLDL Cholesterol Cal: 33 mg/dL (ref 5–40)

## 2024-03-23 LAB — FSH/LH
FSH: 6.5 m[IU]/mL
LH: 9.5 m[IU]/mL

## 2024-03-23 LAB — HEMOGLOBIN A1C
Est. average glucose Bld gHb Est-mCnc: 134 mg/dL
Hgb A1c MFr Bld: 6.3 % — ABNORMAL HIGH (ref 4.8–5.6)

## 2024-03-23 LAB — TSH: TSH: 1.02 u[IU]/mL (ref 0.450–4.500)

## 2024-03-25 DIAGNOSIS — N926 Irregular menstruation, unspecified: Secondary | ICD-10-CM | POA: Insufficient documentation

## 2024-03-25 DIAGNOSIS — Z01419 Encounter for gynecological examination (general) (routine) without abnormal findings: Secondary | ICD-10-CM | POA: Insufficient documentation

## 2024-03-25 DIAGNOSIS — E785 Hyperlipidemia, unspecified: Secondary | ICD-10-CM | POA: Insufficient documentation

## 2024-03-25 DIAGNOSIS — R7309 Other abnormal glucose: Secondary | ICD-10-CM | POA: Insufficient documentation

## 2024-03-25 NOTE — Progress Notes (Signed)
 Outpatient Gynecology Note: Annual Visit  Assessment/Plan:    Tammy Delacruz is a 33 y.o. female [name] with normal well-woman gynecologic exam.   Well woman exam with routine gynecological exam - Reviewed health maintenance topics as documented below. - Pap collected today - Currently not preventing pregnancy - STI screening accepted. -Routine annual labs collected   Irregular menstrual cycle Reports very irregular and rare cycles since she was a teenager. She has 1-2 periods annually. Has never used contraception before. Reviewed possible PCOS based on abdominal weight gain with difficulty losing, irregular periods and difficulties with fertility. Cycles may improve with weight loss but patient may need additional management for success. She is worried about fertility and does desire a pregnancy. She did have a spontaneous ectopic pregnancy 7 years ago requiring removal of her right fallopian tube.  -baseline fertility lab work -OCPs for 6 months to try to achieve cycles with ovulation.  -refer to PCP for possible insulin resistance management.      Risk factors identified in Subjective to review:  Orders Placed This Encounter  Procedures   CBC   Comprehensive metabolic panel with GFR   Hemoglobin A1c   Lipid panel   TSH   Hepatitis C antibody   HIV Antibody (routine testing w rflx)   FSH/LH   POCT urine pregnancy   Current Outpatient Medications  Medication Instructions   norgestimate-ethinyl estradiol (ORTHO-CYCLEN) 0.25-35 MG-MCG tablet 1 tablet, Oral, Daily    No follow-ups on file.    Subjective:    Tammy Delacruz is a 33 y.o. female [name] who presents for annual wellness visit.    CONCERNS? Concerns about irregular cycles throughout adult life.   Well Woman Visit:  GYN HISTORY:  No LMP recorded (lmp unknown).     Menstrual History: OB History     Gravida  1   Para  0   Term  0   Preterm  0   AB  1   Living          SAB  1   IAB  0   Ectopic  0   Multiple      Live Births               No LMP recorded (lmp unknown). Period Pattern: (!) Irregular Menstrual Flow: Heavy Menstrual Control: Tampon, Maxi pad Menstrual Control Change Freq (Hours): 5-6 Dysmenorrhea: None   1-2 cycles per year  STI/HIV testing or immunizations needed? Yes.    Contraceptive methods: no method  Health Maintenance > Reviewed breast self-awareness > History of abnormal mammogram: No > Body mass index is 37 kg/m.  > Safe in current relationship? Yes.   > Concern for alcohol abuse? No   Tobacco Use: Low Risk  (03/22/2024)   Patient History    Smoking Tobacco Use: Never    Smokeless Tobacco Use: Never    Passive Exposure: Not on file      _________________________________________________________  Current Outpatient Medications  Medication Sig Dispense Refill   norgestimate-ethinyl estradiol (ORTHO-CYCLEN) 0.25-35 MG-MCG tablet Take 1 tablet by mouth daily. 84 tablet 2   No current facility-administered medications for this visit.   No Known Allergies  Past Medical History:  Diagnosis Date   Complication of anesthesia    nausea   Medical history non-contributory    Past Surgical History:  Procedure Laterality Date   CHOLECYSTECTOMY     DILATION AND CURETTAGE OF UTERUS     DILATION AND EVACUATION N/A 12/09/2016  Procedure: DILATATION AND EVACUATION;  Surgeon: Colan Dash, MD;  Location: [facility];  Service: Gynecology;  Laterality: N/A;   LAPAROSCOPY N/A 12/09/2016   Procedure: LAPAROSCOPY WITH EXCISION TUBAL PREGNANCY;  Surgeon: Colan Dash, MD;  Location: [facility];  Service: Gynecology;  Laterality: N/A;   UNILATERAL SALPINGECTOMY Right    partial-   OB History     Gravida  1   Para  0   Term  0   Preterm  0   AB  1   Living         SAB  1   IAB  0   Ectopic  0   Multiple      Live Births             Social History   Tobacco Use    Smoking status: Never   Smokeless tobacco: Never  Substance Use Topics   Alcohol use: Yes    Comment: occasionally   Social History   Substance and Sexual Activity  Sexual Activity Yes   Birth control/protection: None     There is no immunization history on file for this patient.   Review Of Systems  Constitutional: Denied constitutional symptoms, night sweats, recent illness, fatigue, fever, insomnia and weight loss.  Eyes: Denied eye symptoms, eye pain, photophobia, vision change and visual disturbance.  Ears/Nose/Throat/Neck: Denied ear, nose, throat or neck symptoms, hearing loss, nasal discharge, sinus congestion and sore throat.  Cardiovascular: Denied cardiovascular symptoms, arrhythmia, chest pain/pressure, edema, exercise intolerance, orthopnea and palpitations.  Respiratory: Denied pulmonary symptoms, asthma, pleuritic pain, productive sputum, cough, dyspnea and wheezing.  Gastrointestinal: Denied, gastro-esophageal reflux, melena, nausea and vomiting.  Genitourinary: Denied genitourinary symptoms including symptomatic vaginal discharge, pelvic relaxation issues, and urinary complaints.  Musculoskeletal: Denied musculoskeletal symptoms, stiffness, swelling, muscle weakness and myalgia.  Dermatologic: Denied dermatology symptoms, rash and scar.  Neurologic: Denied neurology symptoms, dizziness, headache, neck pain and syncope.  Psychiatric: Denied psychiatric symptoms, anxiety and depression.  Endocrine: Denied endocrine symptoms including hot flashes and night sweats.      Objective:    BP 127/77   Pulse 87   Ht 5\' 2"  (1.575 m)   Wt 202 lb 4.8 oz (91.8 kg)   LMP  (LMP Unknown)   BMI 37.00 kg/m   Constitutional: Well-developed, well-nourished female in no acute distress Neurological: Alert and oriented to person, place, and time Psychiatric: Mood and affect appropriate Skin: No rashes or lesions Neck: Supple without masses. Trachea is midline.Thyroid is normal  size without masses Lymphatics: No cervical, axillary, supraclavicular, or inguinal adenopathy noted Respiratory: Clear to auscultation bilaterally. Good air movement with normal work of breathing. Cardiovascular: Regular rate and rhythm. Extremities grossly normal, nontender with no edema; pulses regular Gastrointestinal: Soft, nontender, nondistended. No masses or hernias appreciated. No hepatosplenomegaly. No fluid wave. No rebound or guarding. Breast Exam: normal appearance, no masses or tenderness Genitourinary:         External Genitalia: Normal female genitalia    Vagina:  no lesions.    Cervix: No lesions, normal size and consistency; no cervical motion tenderness     Uterus: Normal size and contour; smooth, mobile, NT, midposition. Adnexae: Non-palpable and non-tender Perineum/Anus: No lesions Rectal: deferred   Donato Fu, CNM

## 2024-03-25 NOTE — Assessment & Plan Note (Signed)
-   Reviewed health maintenance topics as documented below. - Pap collected today - Currently not preventing pregnancy - STI screening accepted. -Routine annual labs collected

## 2024-03-25 NOTE — Assessment & Plan Note (Addendum)
 Reports very irregular and rare cycles since she was a teenager. She has 1-2 periods annually. Has never used contraception before. Reviewed possible PCOS based on abdominal weight gain with difficulty losing, irregular periods and difficulties with fertility. Cycles may improve with weight loss but patient may need additional management for success. She is worried about fertility and does desire a pregnancy. She did have a spontaneous ectopic pregnancy 7 years ago requiring removal of her right fallopian tube.  -baseline fertility lab work -OCPs for 6 months to try to achieve cycles with ovulation.  -refer to PCP for possible insulin resistance management.

## 2024-03-26 ENCOUNTER — Other Ambulatory Visit: Payer: Self-pay | Admitting: Certified Nurse Midwife

## 2024-03-26 LAB — CERVICOVAGINAL ANCILLARY ONLY
Bacterial Vaginitis (gardnerella): POSITIVE — AB
Candida Glabrata: NEGATIVE
Candida Vaginitis: NEGATIVE
Chlamydia: NEGATIVE
Comment: NEGATIVE
Comment: NEGATIVE
Comment: NEGATIVE
Comment: NEGATIVE
Comment: NEGATIVE
Comment: NORMAL
Neisseria Gonorrhea: NEGATIVE
Trichomonas: NEGATIVE

## 2024-03-26 MED ORDER — METRONIDAZOLE 500 MG PO TABS: 500.0000 mg | ORAL_TABLET | Freq: Two times a day (BID) | ORAL | 0 refills | Status: AC

## 2024-03-27 LAB — CYTOLOGY - PAP
Comment: NEGATIVE
Comment: NEGATIVE
Comment: NEGATIVE
Diagnosis: UNDETERMINED — AB
HPV 16: NEGATIVE
HPV 18 / 45: NEGATIVE
High risk HPV: POSITIVE — AB

## 2024-03-29 ENCOUNTER — Encounter: Payer: Self-pay | Admitting: Certified Nurse Midwife

## 2024-03-29 DIAGNOSIS — R87619 Unspecified abnormal cytological findings in specimens from cervix uteri: Secondary | ICD-10-CM | POA: Insufficient documentation

## 2024-03-29 DIAGNOSIS — R87610 Atypical squamous cells of undetermined significance on cytologic smear of cervix (ASC-US): Secondary | ICD-10-CM | POA: Insufficient documentation

## 2024-04-29 ENCOUNTER — Ambulatory Visit: Admitting: Certified Nurse Midwife

## 2024-05-21 NOTE — Progress Notes (Signed)
 Referring Provider:  Damien Parsley, CNM   HPI:  Tammy Delacruz is a 33 y.o.  [name]  who presents today for evaluation and management of abnormal cervical cytology.    Prior pap smears:  Date:03/22/24  Eje:[id number]  HPV: POSITIVE, 16/18/45 NEG   Prior cervical / vaginal findings: n/a  Prior cervical treatment(s): n/a   Symptoms/History:  -Abnormal vaginal discharge: no -Postmenopausal: no -Intermenstrual bleeding: no -Postcoital bleeding: no -Bleeding problems (non-gyn): irregular periods on BC  -Contraception: ORTHO-CYCLEN  -Number of current sexual partners: 1 -Number of partners in lifetime: 8 -History of a high risk partner: no -History of STDs: no -Smoking: no -Gardasil Vaccine: unsure, desires #1 today      ROS:  Pertinent items are noted in HPI.  OB History  Gravida Para Term Preterm AB Living  1 0 0 0 1   SAB IAB Ectopic Multiple Live Births  1 0 0      # Outcome Date GA Lbr Len/2nd Weight Sex Type Anes PTL Lv  1 SAB 2017            Past Medical History:  Diagnosis Date   Complication of anesthesia    nausea   Medical history non-contributory     Past Surgical History:  Procedure Laterality Date   CHOLECYSTECTOMY     DILATION AND CURETTAGE OF UTERUS     DILATION AND EVACUATION N/A 12/09/2016   Procedure: DILATATION AND EVACUATION;  Surgeon: Gladis DELENA Dollar, MD;  Location: [facility];  Service: Gynecology;  Laterality: N/A;   LAPAROSCOPY N/A 12/09/2016   Procedure: LAPAROSCOPY WITH EXCISION TUBAL PREGNANCY;  Surgeon: Gladis DELENA Dollar, MD;  Location: [facility];  Service: Gynecology;  Laterality: N/A;   UNILATERAL SALPINGECTOMY Right    partial-    SOCIAL HISTORY:  Social History   Substance and Sexual Activity  Alcohol Use Yes   Comment: occasionally    Social History   Substance and Sexual Activity  Drug Use No     Family History  Problem Relation Age of Onset   Diabetes Maternal Grandmother    Breast cancer  Neg Hx    Ovarian cancer Neg Hx    Colon cancer Neg Hx    Heart disease Neg Hx    Healthy Mother    Healthy Father     ALLERGIES:  Patient has no known allergies.  She has a current medication list which includes the following prescription(s): norgestimate -ethinyl estradiol .  Physical Exam: -Vitals:  BP 130/65   Pulse 83   Ht 5' 2 (1.575 m)   Wt 195 lb (88.5 kg)   LMP 05/05/2024   BMI 35.67 kg/m   PROCEDURE: Colposcopy performed with 4% acetic acid and Lugol's after informed consent obtained.  Physical Exam Vitals and nursing note reviewed. Exam conducted with a chaperone present.  Constitutional:      Appearance: Normal appearance. She is obese.  HENT:     Head: Normocephalic and atraumatic.   Eyes:     Extraocular Movements: Extraocular movements intact.   Pulmonary:     Effort: Pulmonary effort is normal.  Genitourinary:    Vagina: Normal.        Comments: RED = acetowhite lesions BLUE = biopsies  Musculoskeletal:        General: Normal range of motion.   Neurological:     General: No focal deficit present.     Mental Status: She is alert.   Psychiatric:        Mood and  Affect: Mood normal.                      -Aceto-white Lesions Location(s): See above              -Biopsy performed at 5, 7 o'clock               -ECC indicated and performed: Yes.       -Biopsy sites made hemostatic with pressure and Monsel's solution   -Satisfactory colposcopy: Yes.      -Evidence of Invasive cervical CA :  NO  ASSESSMENT:  Tammy Delacruz is a 33 y.o. [name] with ASCUS and HPV-HR positive 16/18/45 NEG on recent pap (03/22/24), here for colposcopy today, performed as above without complications.  -Ibuprofen  800mg  was administered in clinic post-procedure at pt's request -ECC and 2 cervical bx sent to pathology -Aftercare instructions for home reviewed, si/sx of when to call/return discussed. -Gardasil counseling: #1 given today -Will call with  results   Estil Mangle, DO  [facility]

## 2024-05-21 NOTE — Patient Instructions (Signed)
 Colposcopy, Care After  The following information offers guidance on how to care for yourself after your procedure. Your doctor may also give you more specific instructions. If you have problems or questions, contact your doctor. What can I expect after the procedure? If you did not have a sample of your tissue taken out (did not have a biopsy), you may only have some spotting of blood for a few days. You can go back to your normal activities. If you had a sample of your tissue taken out, it is common to have: Soreness and mild pain. These may last for a few days. Mild bleeding or fluid (discharge) coming from your vagina. The fluid will look dark and grainy. You may have this for a few days. The fluid may be caused by a liquid that was used during your procedure. You may need to wear a sanitary pad. Spotting of blood for at least 48 hours after the procedure. Follow these instructions at home: Medicines Take over-the-counter and prescription medicines only as told by your doctor. Ask your doctor what over-the-counter pain medicines and prescription medicines you can start taking again. This is very important if you take blood thinners. Activity For at least 3 days, or for as long as told by your doctor, avoid: Douching. Using tampons. Having sex. Return to your normal activities as told by your doctor. Ask your doctor what activities are safe for you. General instructions Ask your doctor if you may take baths, swim, or use a hot tub. You may take showers. If you use birth control (contraception), keep using it. Keep all follow-up visits. Contact a doctor if: You have a fever or chills. You faint or feel light-headed. Get help right away if: You bleed a lot from your vagina. A lot of bleeding means that the bleeding soaks through a pad in less than 1 hour. You have clumps of blood (blood clots) coming from your vagina. You have signs that could mean you have an infection. This may be  fluid coming from your vagina that is: Different than normal. Yellow. Bad-smelling. You have very bad pain or cramps in your lower belly that do not get better with medicine. Summary If you did not have a sample of your tissue taken out, you may only have some spotting of blood for a few days. You can go back to your normal activities. If you had a sample of your tissue taken out, it is common to have mild pain for a few days and spotting for 48 hours. Avoid douching, using tampons, and having sex for at least 3 days after the procedure or for as long as told. Get help right away if you have a lot of bleeding, very bad pain, or signs of infection. This information is not intended to replace advice given to you by your health care provider. Make sure you discuss any questions you have with your health care provider. Document Revised: 04/11/2021 Document Reviewed: 04/11/2021 Elsevier Patient Education  2024 [employer].

## 2024-05-27 ENCOUNTER — Ambulatory Visit (INDEPENDENT_AMBULATORY_CARE_PROVIDER_SITE_OTHER): Admitting: Obstetrics

## 2024-05-27 ENCOUNTER — Encounter: Payer: Self-pay | Admitting: Obstetrics

## 2024-05-27 ENCOUNTER — Other Ambulatory Visit (HOSPITAL_COMMUNITY)
Admission: RE | Admit: 2024-05-27 | Discharge: 2024-05-27 | Disposition: A | Discharge status: Home / Self Care | Source: Ambulatory Visit | Attending: Obstetrics | Admitting: Obstetrics

## 2024-05-27 VITALS — BP 130/65 | HR 83 | Ht 62.0 in | Wt 195.0 lb

## 2024-05-27 DIAGNOSIS — Z23 Encounter for immunization: Secondary | ICD-10-CM

## 2024-05-27 DIAGNOSIS — Z3202 Encounter for pregnancy test, result negative: Secondary | ICD-10-CM

## 2024-05-27 DIAGNOSIS — R87610 Atypical squamous cells of undetermined significance on cytologic smear of cervix (ASC-US): Secondary | ICD-10-CM | POA: Diagnosis not present

## 2024-05-27 DIAGNOSIS — N871 Moderate cervical dysplasia: Secondary | ICD-10-CM | POA: Diagnosis not present

## 2024-05-27 DIAGNOSIS — R87810 Cervical high risk human papillomavirus (HPV) DNA test positive: Secondary | ICD-10-CM | POA: Insufficient documentation

## 2024-05-27 DIAGNOSIS — N879 Dysplasia of cervix uteri, unspecified: Secondary | ICD-10-CM | POA: Diagnosis not present

## 2024-05-27 LAB — POCT URINE PREGNANCY: Preg Test, Ur: NEGATIVE

## 2024-05-27 MED ORDER — IBUPROFEN 800 MG PO TABS: 800.0000 mg | ORAL_TABLET | Freq: Once | ORAL | Status: AC

## 2024-05-27 NOTE — Addendum Note (Signed)
 Addended by: LEIGH SOBER on: 05/27/2024 04:18 PM   Modules accepted: Level of Service

## 2024-05-29 LAB — SURGICAL PATHOLOGY

## 2024-06-03 ENCOUNTER — Ambulatory Visit: Payer: Self-pay | Admitting: Obstetrics

## 2024-07-26 ENCOUNTER — Ambulatory Visit

## 2024-07-26 VITALS — BP 115/81 | HR 75 | Ht 62.0 in | Wt 197.8 lb

## 2024-07-26 DIAGNOSIS — Z23 Encounter for immunization: Secondary | ICD-10-CM | POA: Diagnosis not present

## 2024-07-26 NOTE — Patient Instructions (Signed)
 HPV (Human Papillomavirus) Vaccine: What You Need to Know Many vaccine information statements are available in Spanish and other languages. See PromoAge.com.br. 1. Why get vaccinated? HPV (human papillomavirus) vaccine can prevent infection with some types of human papillomavirus. HPV infections can cause certain types of cancers, including: cervical, vaginal, and vulvar cancers in women penile cancer in men anal cancers in both men and women cancers of tonsils, base of tongue, and back of throat (oropharyngeal cancer) in both men and women HPV infections can also cause anogenital warts. HPV vaccine can prevent over 90% of cancers caused by HPV. HPV is spread through intimate skin-to-skin or sexual contact. HPV infections are so common that nearly all people will get at least one type of HPV at some time in their lives. Most HPV infections go away on their own within 2 years. But sometimes HPV infections will last longer and can cause cancers later in life. 2. HPV vaccine HPV vaccine is routinely recommended for adolescents at 79 or 31 years of age to ensure they are protected before they are exposed to the virus. HPV vaccine may be given beginning at age 25 years and vaccination is recommended for everyone through 31 years of age. HPV vaccine may be given to adults 27 through 31 years of age, based on discussions between the patient and health care provider. Most children who get the first dose before 66 years of age need 2 doses of HPV vaccine. People who get the first dose at or after 62 years of age and younger people with certain immunocompromising conditions need 3 doses. Your health care provider can give you more information. HPV vaccine may be given at the same time as other vaccines. 3. Talk with your health care provider Tell your vaccination provider if the person getting the vaccine: Has had an allergic reaction after a previous dose of HPV vaccine, or has any severe,  life-threatening allergies Is pregnant--HPV vaccine is not recommended until after pregnancy In some cases, your health care provider may decide to postpone HPV vaccination until a future visit. People with minor illnesses, such as a cold, may be vaccinated. People who are moderately or severely ill should usually wait until they recover before getting HPV vaccine. Your health care provider can give you more information. 4. Risks of a vaccine reaction Soreness, redness, or swelling where the shot is given can happen after HPV vaccination. Fever or headache can happen after HPV vaccination. People sometimes faint after medical procedures, including vaccination. Tell your provider if you feel dizzy or have vision changes or ringing in the ears. As with any medicine, there is a very remote chance of a vaccine causing a severe allergic reaction, other serious injury, or death. 5. What if there is a serious problem? An allergic reaction could occur after the vaccinated person leaves the clinic. If you see signs of a severe allergic reaction (hives, swelling of the face and throat, difficulty breathing, a fast heartbeat, dizziness, or weakness), call 9-1-1 and get the person to the nearest hospital. For other signs that concern you, call your health care provider. Adverse reactions should be reported to the Vaccine Adverse Event Reporting System (VAERS). Your health care provider will usually file this report, or you can do it yourself. Visit the VAERS website at www.vaers.LAgents.no or call 256-510-8455. VAERS is only for reporting reactions, and VAERS staff members do not give medical advice. 6. The National Vaccine Injury Compensation Program The [employer] Vaccine Injury Compensation Program (VICP) is a  federal program that was created to compensate people who may have been injured by certain vaccines. Claims regarding alleged injury or death due to vaccination have a time limit for filing, which may be as  short as two years. Visit the VICP website at SpiritualWord.at or call 931-622-2373 to learn about the program and about filing a claim. 7. How can I learn more? Ask your health care provider. Call your local or state health department. Visit the website of the Food and Drug Administration (FDA) for vaccine package inserts and additional information at FinderList.no. Contact the Centers for Disease Control and Prevention (CDC): Call 737-738-9397 (1-800-CDC-INFO) or Visit CDC's website at PicCapture.uy. Source: CDC Vaccine Information Statement HPV Vaccine (07/03/2020) This same material is available at FootballExhibition.com.br for no charge. This information is not intended to replace advice given to you by your health care provider. Make sure you discuss any questions you have with your health care provider. Document Revised: 03/01/2023 Document Reviewed: 12/05/2022 Elsevier Patient Education  2024 [employer].

## 2024-07-26 NOTE — Progress Notes (Signed)
    NURSE VISIT NOTE  Subjective:    Patient ID: Tammy Delacruz, female    DOB: Apr 01, 1991, 33 y.o.   MRN: [id number]  HPI  Patient is a 33 y.o. G31P0010 female Single Hispanic female who presents for her second Gardasil injection. Order to administer given by Estil Mangle, MD on 05/27/2024.   Objective:    There were no vitals taken for this visit.  33 y.o. LMP:  07/24/2024  Contraception:  OCP Given by: Camelia Fetters, CMA Site:  left deltoid  Lab Review  No results found for any visits on 07/26/24.    Assessment:   1. Need for HPV vaccine      Plan:   Patient will return in 4 months for third injection.    Camelia Fetters, CMA [facility]

## 2024-09-26 ENCOUNTER — Ambulatory Visit: Admitting: Internal Medicine

## 2024-09-26 ENCOUNTER — Other Ambulatory Visit: Payer: Self-pay

## 2024-09-26 ENCOUNTER — Ambulatory Visit

## 2024-09-26 VITALS — BP 120/72 | HR 62 | Temp 98.5°F | Resp 16 | Ht 62.5 in | Wt 196.7 lb

## 2024-09-26 DIAGNOSIS — R7303 Prediabetes: Secondary | ICD-10-CM

## 2024-09-26 DIAGNOSIS — Z23 Encounter for immunization: Secondary | ICD-10-CM | POA: Diagnosis not present

## 2024-09-26 DIAGNOSIS — E559 Vitamin D deficiency, unspecified: Secondary | ICD-10-CM | POA: Diagnosis not present

## 2024-09-26 DIAGNOSIS — Z114 Encounter for screening for human immunodeficiency virus [HIV]: Secondary | ICD-10-CM

## 2024-09-26 DIAGNOSIS — E782 Mixed hyperlipidemia: Secondary | ICD-10-CM

## 2024-09-26 DIAGNOSIS — R87610 Atypical squamous cells of undetermined significance on cytologic smear of cervix (ASC-US): Secondary | ICD-10-CM

## 2024-09-26 DIAGNOSIS — R87810 Cervical high risk human papillomavirus (HPV) DNA test positive: Secondary | ICD-10-CM

## 2024-09-26 DIAGNOSIS — R7989 Other specified abnormal findings of blood chemistry: Secondary | ICD-10-CM | POA: Diagnosis not present

## 2024-09-26 DIAGNOSIS — L659 Nonscarring hair loss, unspecified: Secondary | ICD-10-CM | POA: Diagnosis not present

## 2024-09-26 DIAGNOSIS — N926 Irregular menstruation, unspecified: Secondary | ICD-10-CM

## 2024-09-26 DIAGNOSIS — Z1159 Encounter for screening for other viral diseases: Secondary | ICD-10-CM

## 2024-09-26 NOTE — Progress Notes (Signed)
 New Patient Office Visit  Subjective    Patient ID: Tammy Delacruz, female    DOB: 1991-10-25  Age: 33 y.o. MRN: [id number]  CC:  Chief Complaint  Patient presents with   Establish Care    HPI Tammy Delacruz presents to establish care.  Discussed the use of AI scribe software for clinical note transcription with the patient, who gave verbal consent to proceed.  History of Present Illness Tammy Delacruz is a 33 year old female who presents for establishing care and evaluation of irregular periods.  She has experienced irregular periods for most of her life and began taking Ortho-Cyclen in April to regulate her menstrual cycle. Since starting the medication, she has had a period at least once a month, although she has not had one yet this month. She is currently in the third or fourth week of her birth control pack and expects her period next week. Hormone testing in April returned normal results, and no specific condition such as PCOS has been diagnosed.  Recent lab work showed slightly elevated liver enzymes and a high A1c level of 6.3. She experiences occasional swelling in her feet and bluish fingertips after alcohol consumption. Her alcohol intake has decreased to about once every two weeks. She has also noticed significant hair loss over the past year.  Her cholesterol levels were slightly elevated, with an LDL of 129. She has no family history of breast or colon cancer. She received her first two HPV vaccines this year and is scheduled for the third in December. Her Pap smear in the spring showed atypical squamous cells of undetermined significance and high-risk HPV, with a follow-up Pap planned for April.  She has been working out since April after being informed of her overweight status and has lost about six pounds, going from 202 pounds in April to 196 pounds currently. She finds it challenging to lose weight.  Irregular  Periods: -Currently on Ortho-Cyclen since April of this year, started by Gynecology   HLD: -Medications: Nothing -Last lipid panel: Lipid Panel     Component Value Date/Time   CHOL 194 03/22/2024 1550   TRIG 183 (H) 03/22/2024 1550   HDL 32 (L) 03/22/2024 1550   CHOLHDL 6.1 (H) 03/22/2024 1550   LDLCALC 129 (H) 03/22/2024 1550   LABVLDL 33 03/22/2024 1550   Pre-Diabetes: -Last A1c 6.3% 4/25 -Not on medications  Health Maintenance: -Blood work due -Pap 4/25 ASCUS, HPV positive, repeat in 1 year -Tdap due   Outpatient Encounter Medications as of 09/26/2024  Medication Sig   norgestimate -ethinyl estradiol  (ORTHO-CYCLEN) 0.25-35 MG-MCG tablet Take 1 tablet by mouth daily.   No facility-administered encounter medications on file as of 09/26/2024.    Past Medical History:  Diagnosis Date   Abnormal pregnancy in first trimester 12/06/2016   Complication of anesthesia    nausea   Medical history non-contributory    Threatened abortion 12/01/2016    Past Surgical History:  Procedure Laterality Date   CHOLECYSTECTOMY     DILATION AND CURETTAGE OF UTERUS     DILATION AND EVACUATION N/A 12/09/2016   Procedure: DILATATION AND EVACUATION;  Surgeon: Gladis DELENA Dollar, MD;  Location: [facility];  Service: Gynecology;  Laterality: N/A;   LAPAROSCOPY N/A 12/09/2016   Procedure: LAPAROSCOPY WITH EXCISION TUBAL PREGNANCY;  Surgeon: Gladis DELENA Dollar, MD;  Location: [facility];  Service: Gynecology;  Laterality: N/A;   UNILATERAL SALPINGECTOMY Right    partial-    Family History  Problem Relation  Age of Onset   Diabetes Maternal Grandmother    Breast cancer Neg Hx    Ovarian cancer Neg Hx    Colon cancer Neg Hx    Heart disease Neg Hx    Healthy Mother    Healthy Father     Social History   Socioeconomic History   Marital status: Single    Spouse name: Not on file   Number of children: Not on file   Years of education: Not on file   Highest education level: 12th grade   Occupational History   Not on file  Tobacco Use   Smoking status: Never   Smokeless tobacco: Never  Vaping Use   Vaping status: Never Used  Substance and Sexual Activity   Alcohol use: Yes    Comment: occasionally   Drug use: No   Sexual activity: Yes    Birth control/protection: None  Other Topics Concern   Not on file  Social History Narrative   ** Merged History Encounter **       Social Drivers of Health   Financial Resource Strain: Low Risk  (09/26/2024)   Overall Financial Resource Strain (CARDIA)    Difficulty of Paying Living Expenses: Not very hard  Food Insecurity: No Food Insecurity (09/26/2024)   Hunger Vital Sign    Worried About Running Out of Food in the Last Year: Never true    Ran Out of Food in the Last Year: Never true  Transportation Needs: No Transportation Needs (09/26/2024)   PRAPARE - Administrator, Civil Service (Medical): No    Lack of Transportation (Non-Medical): No  Physical Activity: Sufficiently Active (09/26/2024)   Exercise Vital Sign    Days of Exercise per Week: 3 days    Minutes of Exercise per Session: 60 min  Stress: Patient Declined (09/26/2024)   [employer] of Occupational Health - Occupational Stress Questionnaire    Feeling of Stress: Patient declined  Social Connections: Moderately Isolated (09/26/2024)   Social Connection and Isolation Panel    Frequency of Communication with Friends and Family: Once a week    Frequency of Social Gatherings with Friends and Family: Once a week    Attends Religious Services: 1 to 4 times per year    Active Member of [employer] or Organizations: No    Attends Engineer, Structural: Not on file    Marital Status: Living with partner  Intimate Partner Violence: Not on file    Review of Systems  All other systems reviewed and are negative.       Objective    BP 120/72 (Cuff Size: Large)   Pulse 62   Temp 98.5 F (36.9 C) (Oral)   Resp 16   Ht 5' 2.5 (1.588  m)   Wt 196 lb 11.2 oz (89.2 kg)   LMP 08/26/2024   SpO2 96%   BMI 35.40 kg/m   Physical Exam Constitutional:      Appearance: Normal appearance.  HENT:     Head: Normocephalic and atraumatic.     Mouth/Throat:     Mouth: Mucous membranes are moist.     Pharynx: Oropharynx is clear.  Eyes:     Extraocular Movements: Extraocular movements intact.     Conjunctiva/sclera: Conjunctivae normal.     Pupils: Pupils are equal, round, and reactive to light.  Neck:     Comments: No thyromegaly Cardiovascular:     Rate and Rhythm: Normal rate and regular rhythm.  Pulmonary:  Effort: Pulmonary effort is normal.     Breath sounds: Normal breath sounds.  Musculoskeletal:     Cervical back: No tenderness.     Right lower leg: No edema.     Left lower leg: No edema.  Lymphadenopathy:     Cervical: No cervical adenopathy.  Skin:    General: Skin is warm and dry.  Neurological:     General: No focal deficit present.     Mental Status: She is alert. Mental status is at baseline.  Psychiatric:        Mood and Affect: Mood normal.        Behavior: Behavior normal.         Assessment & Plan:   Assessment & Plan Irregular menstrual cycles Irregular cycles likely due to hormonal imbalance. Birth control effective since April. No PCOS diagnosis as hormone levels normal. - Continue current birth control regimen.  Prediabetes A1c at 6.3 indicates prediabetes. Discussed importance of monitoring A1c to prevent diabetes progression. - Repeat A1c today.  Overweight Weight loss of 6 pounds since April. Insulin insensitivity may hinder weight loss. Discussed potential GLP-1 medications pending insurance. - Continue current diet and exercise regimen. - Discuss potential GLP-1 medication in one month.  Elevated liver enzymes Mild elevation in liver enzymes noted. No liver disease symptoms. Possible transient elevation. Alcohol and Tylenol  can affect enzymes. Birth control unlikely  cause. - Repeat liver enzyme tests today.  Hyperlipidemia LDL cholesterol slightly elevated at 129. No immediate treatment needed at age 82. Discussed long-term risks of elevated cholesterol.  High-risk HPV infection with ASCUS on Pap smear ASCUS with high-risk HPV on Pap smear. No immediate concern but requires follow-up. HPV vaccination series in progress. - Complete HPV vaccination series in December. - Schedule follow-up Pap smear in April.  Hair loss Significant hair loss possibly due to stress or vitamin deficiency. Vitamin D deficiency can contribute. - Check vitamin D level today.   - HgB A1c - Comprehensive Metabolic Panel (CMET) - Hepatitis C Antibody - HIV antibody (with reflex) - Tdap vaccine greater than or equal to 7yo IM - Vitamin D (25 hydroxy)  Return in about 4 weeks (around 10/24/2024).   Sharyle Fischer, DO

## 2024-09-27 ENCOUNTER — Ambulatory Visit: Payer: Self-pay | Admitting: Internal Medicine

## 2024-09-27 DIAGNOSIS — E559 Vitamin D deficiency, unspecified: Secondary | ICD-10-CM

## 2024-09-27 LAB — COMPREHENSIVE METABOLIC PANEL WITH GFR
AG Ratio: 1.3 (calc) (ref 1.0–2.5)
ALT: 48 U/L — ABNORMAL HIGH (ref 6–29)
AST: 35 U/L — ABNORMAL HIGH (ref 10–30)
Albumin: 4.6 g/dL (ref 3.6–5.1)
Alkaline phosphatase (APISO): 61 U/L (ref 31–125)
BUN: 13 mg/dL (ref 7–25)
CO2: 27 mmol/L (ref 20–32)
Calcium: 9.7 mg/dL (ref 8.6–10.2)
Chloride: 105 mmol/L (ref 98–110)
Creat: 0.62 mg/dL (ref 0.50–0.97)
Globulin: 3.5 g/dL (ref 1.9–3.7)
Glucose, Bld: 102 mg/dL — ABNORMAL HIGH (ref 65–99)
Potassium: 4.5 mmol/L (ref 3.5–5.3)
Sodium: 137 mmol/L (ref 135–146)
Total Bilirubin: 1.9 mg/dL — ABNORMAL HIGH (ref 0.2–1.2)
Total Protein: 8.1 g/dL (ref 6.1–8.1)
eGFR: 122 mL/min/1.73m2 (ref >=60)

## 2024-09-27 LAB — HIV ANTIBODY (ROUTINE TESTING W REFLEX)
HIV 1&2 Ab, 4th Generation: NONREACTIVE
HIV FINAL INTERPRETATION: NEGATIVE

## 2024-09-27 LAB — HEMOGLOBIN A1C
Hgb A1c MFr Bld: 6 % — ABNORMAL HIGH (ref <5.7)
Mean Plasma Glucose: 126 mg/dL
eAG (mmol/L): 7 mmol/L

## 2024-09-27 LAB — HEPATITIS C ANTIBODY: Hepatitis C Ab: NONREACTIVE

## 2024-09-27 LAB — VITAMIN D 25 HYDROXY (VIT D DEFICIENCY, FRACTURES): Vit D, 25-Hydroxy: 9 ng/mL — ABNORMAL LOW (ref 30–100)

## 2024-09-27 MED ORDER — VITAMIN D (ERGOCALCIFEROL) 1.25 MG (50000 UNIT) PO CAPS: 50000.0000 [IU] | ORAL_CAPSULE | ORAL | 0 refills | Status: AC

## 2024-11-01 ENCOUNTER — Ambulatory Visit: Admitting: Internal Medicine

## 2024-11-01 ENCOUNTER — Encounter: Payer: Self-pay | Admitting: Internal Medicine

## 2024-11-01 ENCOUNTER — Other Ambulatory Visit: Payer: Self-pay

## 2024-11-01 VITALS — BP 112/68 | HR 93 | Temp 98.4°F | Resp 16 | Ht 62.0 in | Wt 196.2 lb

## 2024-11-01 DIAGNOSIS — R7989 Other specified abnormal findings of blood chemistry: Secondary | ICD-10-CM

## 2024-11-01 DIAGNOSIS — R7303 Prediabetes: Secondary | ICD-10-CM | POA: Diagnosis not present

## 2024-11-01 DIAGNOSIS — E559 Vitamin D deficiency, unspecified: Secondary | ICD-10-CM | POA: Diagnosis not present

## 2024-11-01 NOTE — Progress Notes (Signed)
 Established Patient Office Visit  Subjective    Patient ID: Tammy Delacruz, female    DOB: [birth year]  Age: 33 y.o. MRN: [id number]  CC:  Chief Complaint  Patient presents with   Follow-up    HPI Tammy Delacruz presents to follow up on recent labs.  Discussed the use of AI scribe software for clinical note transcription with the patient, who gave verbal consent to proceed.  History of Present Illness  Tammy Delacruz is a 33 year old female who presents for follow-up of elevated liver enzymes, prediabetes, and low vitamin D  levels.  Her liver enzymes have been slightly elevated since earlier in the year. Initial labs in April were elevated, then improved but remained mildly high on repeat testing. She started birth control in April after the initial labs, and she wonders if this may be affecting her liver enzymes. She denies frequent Tylenol  or cold medication use. She drinks alcohol infrequently, mainly on some weekends or holidays, and notices foot swelling when she drinks. She had one beer yesterday.  Her A1c was 6.3% in April and is now 6.0%. She has not used medication for prediabetes or PCOS and wants to discuss possible treatment options.  She has been taking prescription vitamin D  supplements as directed and is adherent.  She is currently taking birth control to regulate her menstrual cycles.  Irregular Periods: -Currently on Ortho-Cyclen since April of this year, started by Gynecology but interested in getting pregnant and not sure what the plan is regarding birth control    HLD: -Medications: Nothing -Last lipid panel: Lipid Panel     Component Value Date/Time   CHOL 194 03/22/2024 1550   TRIG 183 (H) 03/22/2024 1550   HDL 32 (L) 03/22/2024 1550   CHOLHDL 6.1 (H) 03/22/2024 1550   LDLCALC 129 (H) 03/22/2024 1550   LABVLDL 33 03/22/2024 1550   Pre-Diabetes: -Last A1c 6.3% 4/25 -Not on medications  Health  Maintenance: -Blood work UTD -Pap 4/25 ASCUS, HPV positive, repeat in 1 year  Outpatient Encounter Medications as of 11/01/2024  Medication Sig   norgestimate -ethinyl estradiol  (ORTHO-CYCLEN) 0.25-35 MG-MCG tablet Take 1 tablet by mouth daily.   Vitamin D , Ergocalciferol , (DRISDOL ) 1.25 MG (50000 UNIT) CAPS capsule Take 1 capsule (50,000 Units total) by mouth every 7 (seven) days.   No facility-administered encounter medications on file as of 11/01/2024.    Past Medical History:  Diagnosis Date   Abnormal pregnancy in first trimester 12/06/2016   Complication of anesthesia    nausea   Medical history non-contributory    Threatened abortion 12/01/2016    Past Surgical History:  Procedure Laterality Date   CHOLECYSTECTOMY     DILATION AND CURETTAGE OF UTERUS     DILATION AND EVACUATION N/A 12/09/2016   Procedure: DILATATION AND EVACUATION;  Surgeon: Gladis DELENA Dollar, MD;  Location: [facility];  Service: Gynecology;  Laterality: N/A;   LAPAROSCOPY N/A 12/09/2016   Procedure: LAPAROSCOPY WITH EXCISION TUBAL PREGNANCY;  Surgeon: Gladis DELENA Dollar, MD;  Location: [facility];  Service: Gynecology;  Laterality: N/A;   UNILATERAL SALPINGECTOMY Right    partial-    Family History  Problem Relation Age of Onset   Diabetes Maternal Grandmother    Breast cancer Neg Hx    Ovarian cancer Neg Hx    Colon cancer Neg Hx    Heart disease Neg Hx    Healthy Mother    Healthy Father     Social History   Socioeconomic History  Marital status: Single    Spouse name: Not on file   Number of children: Not on file   Years of education: Not on file   Highest education level: 12th grade  Occupational History   Not on file  Tobacco Use   Smoking status: Never   Smokeless tobacco: Never  Vaping Use   Vaping status: Never Used  Substance and Sexual Activity   Alcohol use: Yes    Comment: occasionally   Drug use: No   Sexual activity: Yes    Birth control/protection: None  Other  Topics Concern   Not on file  Social History Narrative   ** Merged History Encounter **       Social Drivers of Health   Financial Resource Strain: Low Risk  (09/26/2024)   Overall Financial Resource Strain (CARDIA)    Difficulty of Paying Living Expenses: Not very hard  Food Insecurity: No Food Insecurity (09/26/2024)   Hunger Vital Sign    Worried About Running Out of Food in the Last Year: Never true    Ran Out of Food in the Last Year: Never true  Transportation Needs: No Transportation Needs (09/26/2024)   PRAPARE - Administrator, Civil Service (Medical): No    Lack of Transportation (Non-Medical): No  Physical Activity: Sufficiently Active (09/26/2024)   Exercise Vital Sign    Days of Exercise per Week: 3 days    Minutes of Exercise per Session: 60 min  Stress: Patient Declined (09/26/2024)   [employer] of Occupational Health - Occupational Stress Questionnaire    Feeling of Stress: Patient declined  Social Connections: Moderately Isolated (09/26/2024)   Social Connection and Isolation Panel    Frequency of Communication with Friends and Family: Once a week    Frequency of Social Gatherings with Friends and Family: Once a week    Attends Religious Services: 1 to 4 times per year    Active Member of [employer] or Organizations: No    Attends Engineer, Structural: Not on file    Marital Status: Living with partner  Intimate Partner Violence: Not on file    Review of Systems  Gastrointestinal:  Negative for abdominal pain, nausea and vomiting.  All other systems reviewed and are negative.       Objective    BP 112/68 (Cuff Size: Large)   Pulse 93   Temp 98.4 F (36.9 C) (Oral)   Resp 16   Ht 5' 2 (1.575 m)   Wt 196 lb 3.2 oz (89 kg)   SpO2 94%   BMI 35.89 kg/m   Physical Exam Constitutional:      Appearance: Normal appearance.  HENT:     Head: Normocephalic and atraumatic.  Eyes:     Conjunctiva/sclera: Conjunctivae normal.   Cardiovascular:     Rate and Rhythm: Normal rate and regular rhythm.  Pulmonary:     Effort: Pulmonary effort is normal.     Breath sounds: Normal breath sounds.  Skin:    General: Skin is warm and dry.  Neurological:     General: No focal deficit present.     Mental Status: She is alert. Mental status is at baseline.  Psychiatric:        Mood and Affect: Mood normal.        Behavior: Behavior normal.         Assessment & Plan:   Assessment & Plan  Evaluation of elevated liver enzymes Liver enzymes slightly elevated, improved but above  normal. Differential includes reactive causes, birth control effects, fatty liver disease. Further evaluation needed to rule out hepatitis or autoimmune conditions. - Repeated liver enzyme tests. - Ordered tests for hepatitis, autoimmune markers, rare syndromes. - Plan liver ultrasound if enzymes remain elevated.  Prediabetes A1c improved to 6.0%, still prediabetic. Discussed GLP-1 receptor agonists for weight loss, A1c control, PCOS symptoms. Insurance coverage uncertain, may need evidence of fatty liver disease for approval. - Monitor A1c levels. - Discuss GLP-1 receptor agonists if insurance coverage confirmed and fatty liver disease diagnosed.  Vitamin D  deficiency Vitamin D  level is low, continue prescription strength supplements.   General health maintenance Discussed flu vaccination, declined. - Offered flu vaccination.  - Comprehensive Metabolic Panel (CMET) - Antinuclear Antib (ANA) - Ceruloplasmin - Hepatitis, Acute   Return in about 6 months (around 05/02/2025).   Sharyle Fischer, DO

## 2024-11-04 ENCOUNTER — Ambulatory Visit: Payer: Self-pay | Admitting: Internal Medicine

## 2024-11-04 DIAGNOSIS — R7989 Other specified abnormal findings of blood chemistry: Secondary | ICD-10-CM

## 2024-11-04 LAB — COMPREHENSIVE METABOLIC PANEL WITH GFR
AG Ratio: 1.4 (calc) (ref 1.0–2.5)
ALT: 93 U/L — ABNORMAL HIGH (ref 6–29)
AST: 77 U/L — ABNORMAL HIGH (ref 10–30)
Albumin: 4.5 g/dL (ref 3.6–5.1)
Alkaline phosphatase (APISO): 69 U/L (ref 31–125)
BUN: 8 mg/dL (ref 7–25)
CO2: 29 mmol/L (ref 20–32)
Calcium: 9.5 mg/dL (ref 8.6–10.2)
Chloride: 102 mmol/L (ref 98–110)
Creat: 0.67 mg/dL (ref 0.50–0.97)
Globulin: 3.2 g/dL (ref 1.9–3.7)
Glucose, Bld: 128 mg/dL — ABNORMAL HIGH (ref 65–99)
Potassium: 4.1 mmol/L (ref 3.5–5.3)
Sodium: 140 mmol/L (ref 135–146)
Total Bilirubin: 0.8 mg/dL (ref 0.2–1.2)
Total Protein: 7.7 g/dL (ref 6.1–8.1)
eGFR: 120 mL/min/1.73m2 (ref >=60)

## 2024-11-04 LAB — CERULOPLASMIN: Ceruloplasmin: 27 mg/dL (ref 14–48)

## 2024-11-04 LAB — HEPATITIS PANEL, ACUTE
Hep A IgM: NONREACTIVE
Hep B C IgM: NONREACTIVE
Hepatitis B Surface Ag: NONREACTIVE
Hepatitis C Ab: NONREACTIVE

## 2024-11-04 LAB — ANA: Anti Nuclear Antibody (ANA): NEGATIVE

## 2024-11-11 ENCOUNTER — Ambulatory Visit: Admission: RE | Admit: 2024-11-11 | Discharge: 2024-11-11 | Attending: Internal Medicine

## 2024-11-11 ENCOUNTER — Ambulatory Visit: Payer: Self-pay | Admitting: Internal Medicine

## 2024-11-11 DIAGNOSIS — R7989 Other specified abnormal findings of blood chemistry: Secondary | ICD-10-CM | POA: Insufficient documentation

## 2024-11-11 DIAGNOSIS — K760 Fatty (change of) liver, not elsewhere classified: Secondary | ICD-10-CM | POA: Diagnosis not present

## 2024-11-25 ENCOUNTER — Ambulatory Visit

## 2024-11-25 VITALS — BP 132/84 | HR 89 | Wt 192.3 lb

## 2024-11-25 DIAGNOSIS — Z23 Encounter for immunization: Secondary | ICD-10-CM | POA: Diagnosis not present

## 2024-11-25 NOTE — Progress Notes (Signed)
" ° ° °  NURSE VISIT NOTE  Subjective:    Patient ID: Tammy Delacruz, female    DOB: 01/10/1991, 33 y.o.   MRN: [id number]  HPI  Patient is a 33 y.o. G6P0010 female Single Hispanic female who presents for her third Gardasil injection. Order to administer given by Estil Mangle, MD on 05/27/2024.   Objective:    Wt 192 lb 4.8 oz (87.2 kg)   BMI 35.17 kg/m   33 y.o. LMP:  11/23/2024  Contraception:  OCP Given by: Burnard Ro, CMA Site:  right deltoid      Assessment:   1. Need for HPV vaccine      Plan:   Patient will return in prn. Gardasil series complete.   Burnard LITTIE Ro, CMA  "

## 2024-12-19 ENCOUNTER — Other Ambulatory Visit: Payer: Self-pay | Admitting: Internal Medicine

## 2024-12-19 DIAGNOSIS — E559 Vitamin D deficiency, unspecified: Secondary | ICD-10-CM

## 2024-12-19 NOTE — Telephone Encounter (Signed)
 Requested medication (s) are due for refill today: na   Requested medication (s) are on the active medication list: yes   Last refill:  09/27/24 #12 0 refills  Future visit scheduled: yes 05/02/25  Notes to clinic:  manual review. Do you want to refill Rx?     Requested Prescriptions  Pending Prescriptions Disp Refills   Vitamin D , Ergocalciferol , (DRISDOL ) 1.25 MG (50000 UNIT) CAPS capsule [Pharmacy Med Name: VITAMIN D2 1.25MG (50,000 UNIT)] 4 capsule 2    Sig: TAKE 1 CAPSULE (50,000 UNITS) BY MOUTH EVERY 7 DAYS     Endocrinology:  Vitamins - Vitamin D  Supplementation 2 Failed - 12/19/2024  2:35 PM      Failed - Manual Review: Route requests for 50,000 IU strength to the provider      Failed - Vitamin D  in normal range and within 360 days    Vit D, 25-Hydroxy  Date Value Ref Range Status  09/26/2024 9 (L) 30 - 100 ng/mL Final    Comment:    Vitamin D  Status         25-OH Vitamin D : . Deficiency:                    <20 ng/mL Insufficiency:             20 - 29 ng/mL Optimal:                 > or = 30 ng/mL . For 25-OH Vitamin D  testing on patients on  D2-supplementation and patients for whom quantitation  of D2 and D3 fractions is required, the [facility](TM) 25-OH VIT D, (D2,D3), LC/MS/MS is recommended: order  code 07111 (patients >20yrs). . See Note 1 . Note 1 . For additional information, please refer to  http://education.[facility].com/faq/FAQ199  (This link is being provided for informational/ educational purposes only.)          Passed - Ca in normal range and within 360 days    Calcium  Date Value Ref Range Status  11/01/2024 9.5 8.6 - 10.2 mg/dL Final   Calcium, Total  Date Value Ref Range Status  02/26/2012 8.5 (L) 9.0 - 10.7 mg/dL Final         Passed - Valid encounter within last 12 months    Recent Outpatient Visits           1 month ago Elevated LFTs   [facility] Bernardo Fend, DO   2 months ago  Prediabetes   [facility] Bernardo Fend, [place]

## 2025-05-02 ENCOUNTER — Ambulatory Visit: Admitting: Internal Medicine
